# Patient Record
Sex: Male | Born: 1935 | Race: White | Hispanic: No | Marital: Married | Smoking: Former smoker
Health system: Southern US, Community
[De-identification: ages and names within clinical notes are randomized; demographics above are authoritative.]

## PROBLEM LIST (undated history)

## (undated) DIAGNOSIS — I10 Essential (primary) hypertension: Secondary | ICD-10-CM

## (undated) DIAGNOSIS — T7840XA Allergy, unspecified, initial encounter: Secondary | ICD-10-CM

## (undated) HISTORY — DX: Essential (primary) hypertension: I10

## (undated) HISTORY — DX: Allergy, unspecified, initial encounter: T78.40XA

## (undated) HISTORY — PX: NASAL SEPTUM SURGERY: SHX37

## (undated) HISTORY — PX: APPENDECTOMY: SHX54

## (undated) HISTORY — PX: EYE SURGERY: SHX253

---

## 2008-09-23 ENCOUNTER — Ambulatory Visit: Payer: Self-pay | Admitting: Otolaryngology

## 2009-11-25 ENCOUNTER — Ambulatory Visit: Payer: Self-pay | Admitting: Otolaryngology

## 2013-11-19 ENCOUNTER — Encounter: Payer: Self-pay | Admitting: Podiatry

## 2013-11-19 ENCOUNTER — Ambulatory Visit (INDEPENDENT_AMBULATORY_CARE_PROVIDER_SITE_OTHER): Payer: Medicare Other

## 2013-11-19 ENCOUNTER — Ambulatory Visit (INDEPENDENT_AMBULATORY_CARE_PROVIDER_SITE_OTHER): Payer: Medicare Other | Admitting: Podiatry

## 2013-11-19 VITALS — BP 91/68 | HR 67 | Resp 16 | Ht 67.0 in | Wt 203.0 lb

## 2013-11-19 DIAGNOSIS — M722 Plantar fascial fibromatosis: Secondary | ICD-10-CM

## 2013-11-19 DIAGNOSIS — M2420 Disorder of ligament, unspecified site: Secondary | ICD-10-CM

## 2013-11-19 DIAGNOSIS — M629 Disorder of muscle, unspecified: Secondary | ICD-10-CM

## 2013-11-19 MED ORDER — MELOXICAM 15 MG PO TABS: 15.0000 mg | ORAL_TABLET | Freq: Every day | ORAL | Status: AC

## 2013-11-19 MED ORDER — METHYLPREDNISOLONE (PAK) 4 MG PO TABS: ORAL_TABLET | ORAL | Status: DC

## 2013-11-19 NOTE — Progress Notes (Signed)
   Subjective:    Patient ID: Gerald Weaver, male    DOB: December 14, 1935, 78 y.o.   MRN: [id number]  HPI Comments: Left heel pain, sore. Its been going on for 6 plus weeks, seen another doctor [facility] , dr fowler gave two cortisone injections, gave me antiinflammatory pills, told me to roll the foot on a frozen water bottle , and a night splint.   Foot Pain Associated symptoms include coughing.      Review of Systems  HENT:       Sinus problems  Ringing in ears   Respiratory: Positive for cough.   Musculoskeletal: Positive for back pain.  All other systems reviewed and are negative.       Objective:   Physical Exam: I have reviewed his past history medications allergies surgeries social history and review of systems. Pulses are strongly palpable bilateral. Neurologic sensorium is intact per since once the monofilament. Deep tendon reflexes are intact bilateral. Muscle strength +5 over 5 dorsiflexors plantar flexors inverters everters all intrinsic musculature is intact orthopedic evaluation demonstrates all joints distal to the ankle a full range of motion without crepitation. He has no pain on medial and lateral compression of the calcaneus left. He does have pain on direct palpation of the medial calcaneal tubercle and just slightly distal to that area I am unable to palpate the medial band of the plantar fascia of the left foot but easily palpable on the right foot. He also demonstrates hallux malleus to the right foot. Radiographic evaluation does demonstrate moderate to severe inflammation at the point of attachment at the plantar fascia to the calcaneus. There does appear to be any increased area of intensity just distal to the insertion site of the plantar fascia on the calcaneus. This could be indicative of a tear. Cutaneous evaluation demonstrates supple well hydrated cutis no erythema edema cellulitis drainage or odor.        Assessment & Plan:  Assessment: Possible  tear of the plantar fascia of the left foot. This is associated with trauma.  Plan: Discussed etiology pathology conservative versus surgical therapies. I placed him in a [employer]. Gave him specific instructions as how I want him to use this and I wrote a prescription for Medrol Dosepak he will then follow that with [facility] 1 a day. I will followup with him in 3-4 weeks.

## 2013-11-19 NOTE — Patient Instructions (Signed)
Plantar Fasciitis (Heel Spur Syndrome) with Rehab The plantar fascia is a fibrous, ligament-like, soft-tissue structure that spans the bottom of the foot. Plantar fasciitis is a condition that causes pain in the foot due to inflammation of the tissue. SYMPTOMS   Pain and tenderness on the underneath side of the foot.  Pain that worsens with standing or walking. CAUSES  Plantar fasciitis is caused by irritation and injury to the plantar fascia on the underneath side of the foot. Common mechanisms of injury include:  Direct trauma to bottom of the foot.  Damage to a small nerve that runs under the foot where the main fascia attaches to the heel bone.  Stress placed on the plantar fascia due to bone spurs. RISK INCREASES WITH:   Activities that place stress on the plantar fascia (running, jumping, pivoting, or cutting).  Poor strength and flexibility.  Improperly fitted shoes.  Tight calf muscles.  Flat feet.  Failure to warm-up properly before activity.  Obesity. PREVENTION  Warm up and stretch properly before activity.  Allow for adequate recovery between workouts.  Maintain physical fitness:  Strength, flexibility, and endurance.  Cardiovascular fitness.  Maintain a health body weight.  Avoid stress on the plantar fascia.  Wear properly fitted shoes, including arch supports for individuals who have flat feet. PROGNOSIS  If treated properly, then the symptoms of plantar fasciitis usually resolve without surgery. However, occasionally surgery is necessary. RELATED COMPLICATIONS   Recurrent symptoms that may result in a chronic condition.  Problems of the lower back that are caused by compensating for the injury, such as limping.  Pain or weakness of the foot during push-off following surgery.  Chronic inflammation, scarring, and partial or complete fascia tear, occurring more often from repeated injections. TREATMENT  Treatment initially involves the use of  ice and medication to help reduce pain and inflammation. The use of strengthening and stretching exercises may help reduce pain with activity, especially stretches of the Achilles tendon. These exercises may be performed at home or with a therapist. Your caregiver may recommend that you use heel cups of arch supports to help reduce stress on the plantar fascia. Occasionally, corticosteroid injections are given to reduce inflammation. If symptoms persist for greater than 6 months despite non-surgical (conservative), then surgery may be recommended.  MEDICATION   If pain medication is necessary, then nonsteroidal anti-inflammatory medications, such as aspirin and ibuprofen, or other minor pain relievers, such as acetaminophen, are often recommended.  Do not take pain medication within 7 days before surgery.  Prescription pain relievers may be given if deemed necessary by your caregiver. Use only as directed and only as much as you need.  Corticosteroid injections may be given by your caregiver. These injections should be reserved for the most serious cases, because they may only be given a certain number of times. HEAT AND COLD  Cold treatment (icing) relieves pain and reduces inflammation. Cold treatment should be applied for 10 to 15 minutes every 2 to 3 hours for inflammation and pain and immediately after any activity that aggravates your symptoms. Use ice packs or massage the area with a piece of ice (ice massage).  Heat treatment may be used prior to performing the stretching and strengthening activities prescribed by your caregiver, physical therapist, or athletic trainer. Use a heat pack or soak the injury in warm water. SEEK IMMEDIATE MEDICAL CARE IF:  Treatment seems to offer no benefit, or the condition worsens.  Any medications produce adverse side effects. EXERCISES RANGE   OF MOTION (ROM) AND STRETCHING EXERCISES - Plantar Fasciitis (Heel Spur Syndrome) These exercises may help you  when beginning to rehabilitate your injury. Your symptoms may resolve with or without further involvement from your physician, physical therapist or athletic trainer. While completing these exercises, remember:   Restoring tissue flexibility helps normal motion to return to the joints. This allows healthier, less painful movement and activity.  An effective stretch should be held for at least 30 seconds.  A stretch should never be painful. You should only feel a gentle lengthening or release in the stretched tissue. RANGE OF MOTION - Toe Extension, Flexion  Sit with your right / left leg crossed over your opposite knee.  Grasp your toes and gently pull them back toward the top of your foot. You should feel a stretch on the bottom of your toes and/or foot.  Hold this stretch for __________ seconds.  Now, gently pull your toes toward the bottom of your foot. You should feel a stretch on the top of your toes and or foot.  Hold this stretch for __________ seconds. Repeat __________ times. Complete this stretch __________ times per day.  RANGE OF MOTION - Ankle Dorsiflexion, Active Assisted  Remove shoes and sit on a chair that is preferably not on a carpeted surface.  Place right / left foot under knee. Extend your opposite leg for support.  Keeping your heel down, slide your right / left foot back toward the chair until you feel a stretch at your ankle or calf. If you do not feel a stretch, slide your bottom forward to the edge of the chair, while still keeping your heel down.  Hold this stretch for __________ seconds. Repeat __________ times. Complete this stretch __________ times per day.  STRETCH  Gastroc, Standing  Place hands on wall.  Extend right / left leg, keeping the front knee somewhat bent.  Slightly point your toes inward on your back foot.  Keeping your right / left heel on the floor and your knee straight, shift your weight toward the wall, not allowing your back to  arch.  You should feel a gentle stretch in the right / left calf. Hold this position for __________ seconds. Repeat __________ times. Complete this stretch __________ times per day. STRETCH  Soleus, Standing  Place hands on wall.  Extend right / left leg, keeping the other knee somewhat bent.  Slightly point your toes inward on your back foot.  Keep your right / left heel on the floor, bend your back knee, and slightly shift your weight over the back leg so that you feel a gentle stretch deep in your back calf.  Hold this position for __________ seconds. Repeat __________ times. Complete this stretch __________ times per day. STRETCH  Gastrocsoleus, Standing  Note: This exercise can place a lot of stress on your foot and ankle. Please complete this exercise only if specifically instructed by your caregiver.   Place the ball of your right / left foot on a step, keeping your other foot firmly on the same step.  Hold on to the wall or a rail for balance.  Slowly lift your other foot, allowing your body weight to press your heel down over the edge of the step.  You should feel a stretch in your right / left calf.  Hold this position for __________ seconds.  Repeat this exercise with a slight bend in your right / left knee. Repeat __________ times. Complete this stretch __________ times per day.    STRENGTHENING EXERCISES - Plantar Fasciitis (Heel Spur Syndrome)  These exercises may help you when beginning to rehabilitate your injury. They may resolve your symptoms with or without further involvement from your physician, physical therapist or athletic trainer. While completing these exercises, remember:   Muscles can gain both the endurance and the strength needed for everyday activities through controlled exercises.  Complete these exercises as instructed by your physician, physical therapist or athletic trainer. Progress the resistance and repetitions only as guided. STRENGTH - Towel  Curls  Sit in a chair positioned on a non-carpeted surface.  Place your foot on a towel, keeping your heel on the floor.  Pull the towel toward your heel by only curling your toes. Keep your heel on the floor.  If instructed by your physician, physical therapist or athletic trainer, add ____________________ at the end of the towel. Repeat __________ times. Complete this exercise __________ times per day. STRENGTH - Ankle Inversion  Secure one end of a rubber exercise band/tubing to a fixed object (table, pole). Loop the other end around your foot just before your toes.  Place your fists between your knees. This will focus your strengthening at your ankle.  Slowly, pull your big toe up and in, making sure the band/tubing is positioned to resist the entire motion.  Hold this position for __________ seconds.  Have your muscles resist the band/tubing as it slowly pulls your foot back to the starting position. Repeat __________ times. Complete this exercises __________ times per day.  Document Released: 08/14/2005 Document Revised: 11/06/2011 Document Reviewed: 11/26/2008 [employer] Patient Information 2014 [employer]. Plantar Fasciitis Plantar fasciitis is a common condition that causes foot pain. It is soreness (inflammation) of the band of tough fibrous tissue on the bottom of the foot that runs from the heel bone (calcaneus) to the ball of the foot. The cause of this soreness may be from excessive standing, poor fitting shoes, running on hard surfaces, being overweight, having an abnormal walk, or overuse (this is common in runners) of the painful foot or feet. It is also common in aerobic exercise dancers and ballet dancers. SYMPTOMS  Most people with plantar fasciitis complain of:  Severe pain in the morning on the bottom of their foot especially when taking the first steps out of bed. This pain recedes after a few minutes of walking.  Severe pain is experienced also during walking  following a long period of inactivity.  Pain is worse when walking barefoot or up stairs DIAGNOSIS   Your caregiver will diagnose this condition by examining and feeling your foot.  Special tests such as X-rays of your foot, are usually not needed. PREVENTION   Consult a sports medicine professional before beginning a new exercise program.  Walking programs offer a good workout. With walking there is a lower chance of overuse injuries common to runners. There is less impact and less jarring of the joints.  Begin all new exercise programs slowly. If problems or pain develop, decrease the amount of time or distance until you are at a comfortable level.  Wear good shoes and replace them regularly.  Stretch your foot and the heel cords at the back of the ankle (Achilles tendon) both before and after exercise.  Run or exercise on even surfaces that are not hard. For example, asphalt is better than pavement.  Do not run barefoot on hard surfaces.  If using a treadmill, vary the incline.  Do not continue to workout if you have foot or joint   problems. Seek professional help if they do not improve. HOME CARE INSTRUCTIONS   Avoid activities that cause you pain until you recover.  Use ice or cold packs on the problem or painful areas after working out.  Only take over-the-counter or prescription medicines for pain, discomfort, or fever as directed by your caregiver.  Soft shoe inserts or athletic shoes with air or gel sole cushions may be helpful.  If problems continue or become more severe, consult a sports medicine caregiver or your own health care provider. Cortisone is a potent anti-inflammatory medication that may be injected into the painful area. You can discuss this treatment with your caregiver. MAKE SURE YOU:   Understand these instructions.  Will watch your condition.  Will get help right away if you are not doing well or get worse. Document Released: 05/09/2001 Document  Revised: 11/06/2011 Document Reviewed: 07/08/2008 [employer] Patient Information 2014 [employer].  

## 2013-12-12 ENCOUNTER — Ambulatory Visit: Payer: Self-pay | Admitting: Nurse Practitioner

## 2013-12-12 ENCOUNTER — Emergency Department: Payer: Self-pay | Admitting: Emergency Medicine

## 2013-12-12 LAB — COMPREHENSIVE METABOLIC PANEL
ALBUMIN: 3.8 g/dL (ref 3.4–5.0)
ALT: 33 U/L (ref 12–78)
ANION GAP: 5 — AB (ref 7–16)
AST: 57 U/L — AB (ref 15–37)
Alkaline Phosphatase: 103 U/L
BILIRUBIN TOTAL: 0.6 mg/dL (ref 0.2–1.0)
BUN: 24 mg/dL — AB (ref 7–18)
CHLORIDE: 105 mmol/L (ref 98–107)
CO2: 25 mmol/L (ref 21–32)
CREATININE: 1.06 mg/dL (ref 0.60–1.30)
Calcium, Total: 8.9 mg/dL (ref 8.5–10.1)
EGFR (African American): >60
GLUCOSE: 81 mg/dL (ref 65–99)
OSMOLALITY: 273 (ref 275–301)
POTASSIUM: 4.8 mmol/L (ref 3.5–5.1)
SODIUM: 135 mmol/L — AB (ref 136–145)
TOTAL PROTEIN: 7.7 g/dL (ref 6.4–8.2)

## 2013-12-12 LAB — PROTIME-INR
INR: 0.9
Prothrombin Time: 12.2 secs (ref 11.5–14.7)

## 2013-12-12 LAB — CBC
HCT: 41.7 % (ref 40.0–52.0)
HGB: 13.3 g/dL (ref 13.0–18.0)
MCH: 27.6 pg (ref 26.0–34.0)
MCHC: 31.9 g/dL — ABNORMAL LOW (ref 32.0–36.0)
MCV: 87 fL (ref 80–100)
Platelet: 211 10*3/uL (ref 150–440)
RBC: 4.82 10*6/uL (ref 4.40–5.90)
RDW: 14.1 % (ref 11.5–14.5)
WBC: 8.9 10*3/uL (ref 3.8–10.6)

## 2013-12-12 LAB — APTT: ACTIVATED PTT: 27.1 s (ref 23.6–35.9)

## 2013-12-12 LAB — D-DIMER(ARMC): D-Dimer: >7500 ng/ml

## 2013-12-15 ENCOUNTER — Ambulatory Visit (INDEPENDENT_AMBULATORY_CARE_PROVIDER_SITE_OTHER): Payer: Medicare Other | Admitting: Podiatry

## 2013-12-15 VITALS — Resp 16 | Ht 67.0 in | Wt 203.0 lb

## 2013-12-15 DIAGNOSIS — M722 Plantar fascial fibromatosis: Secondary | ICD-10-CM

## 2013-12-15 NOTE — Progress Notes (Signed)
He presents today for followup of his tear of his plantar fasciitis of his left heel. States that he to the [employer] off for the last 2 days and seems to be doing quite well without it. He does relate that he is recently diagnosed with a DVT of his right calf. He is currently taking blood thinners.  Objective: Vital signs are stable he is alert and oriented x3 pulses are palpable. No pain to his left foot.  Assessment: Plantar fasciitis with a rupture of the plantar fascial left heel. DVT right calf.  Plan:

## 2014-01-07 ENCOUNTER — Ambulatory Visit (INDEPENDENT_AMBULATORY_CARE_PROVIDER_SITE_OTHER): Payer: Medicare Other | Admitting: Podiatry

## 2014-01-07 DIAGNOSIS — M722 Plantar fascial fibromatosis: Secondary | ICD-10-CM

## 2014-01-07 NOTE — Progress Notes (Signed)
He presented today to pick up his orthotics both oral and written home-going instructions were given for the use of the orthotics and will followup with him in one month.

## 2014-01-07 NOTE — Patient Instructions (Signed)

## 2014-02-04 ENCOUNTER — Ambulatory Visit (INDEPENDENT_AMBULATORY_CARE_PROVIDER_SITE_OTHER): Payer: Medicare Other | Admitting: Podiatry

## 2014-02-04 VITALS — BP 113/73 | HR 63 | Resp 16

## 2014-02-04 DIAGNOSIS — M722 Plantar fascial fibromatosis: Secondary | ICD-10-CM

## 2014-02-04 NOTE — Progress Notes (Signed)
His presents today for followup of his orthotics. He states that his orthotics for approximately 50% of the time. His right foot he has no problems with however his left foot he states he has pain along the medial longitudinal arch it feels like pressure at times.  Objective: Vital signs are stable he is alert and oriented x3 is no reproducible pain on palpation of the medial continued tubercles bilateral or of the arch. Evaluation of the orthotics appear to be spot on as far as the fabrication of the orthotic goes.  Assessment: Slowly resolving plantar fasciitis left foot.  Plan: Continue use of the orthotics followup with me as needed. And continue all conservative therapies until his 100% well.

## 2014-04-03 DIAGNOSIS — M722 Plantar fascial fibromatosis: Secondary | ICD-10-CM

## 2014-04-27 ENCOUNTER — Telehealth: Payer: Self-pay | Admitting: Podiatry

## 2014-04-27 ENCOUNTER — Encounter: Payer: Self-pay | Admitting: Podiatry

## 2014-04-27 NOTE — Telephone Encounter (Signed)
Notified patient that his 2nd pair of orthotics are in and he can stop by the office at any time to pick up.

## 2015-11-29 IMAGING — US US EXTREM LOW VENOUS*R*
1 series · 13 of 24 positions shown · non-contrast
Comparison: None

CLINICAL DATA: Right leg pain and swelling



[Series 1: us extrem low venous*right* · 0.08mm/px · 13 of 34 slices shown]
[im 1/34]
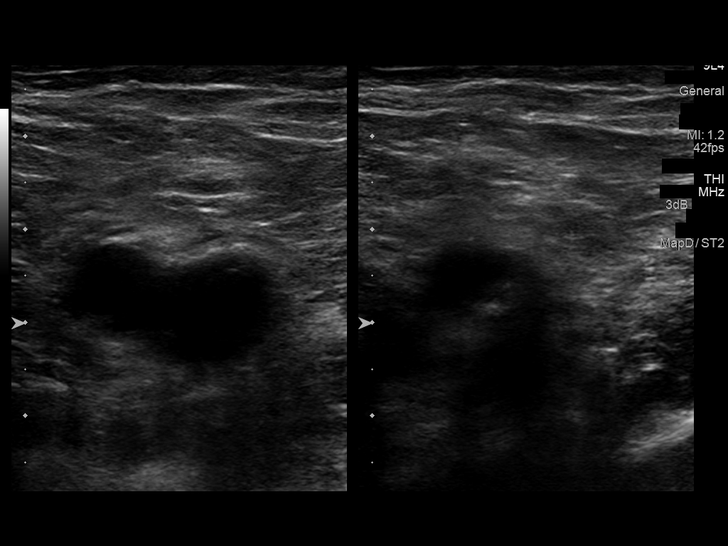
[im 3/34]
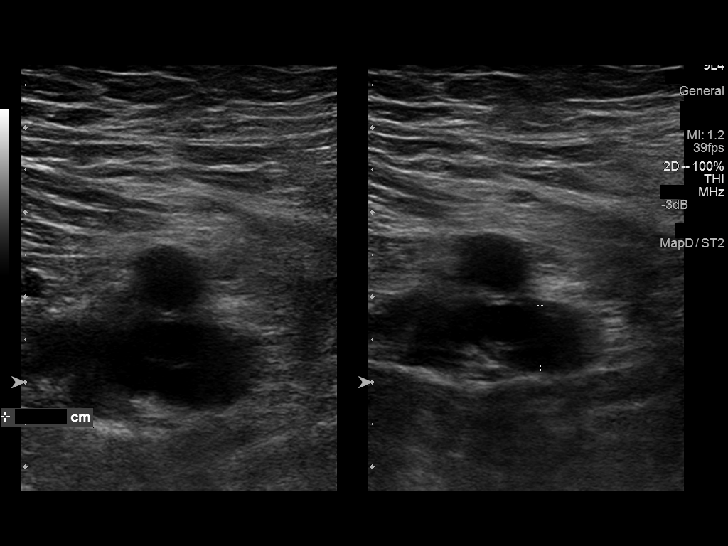
[im 6/34]
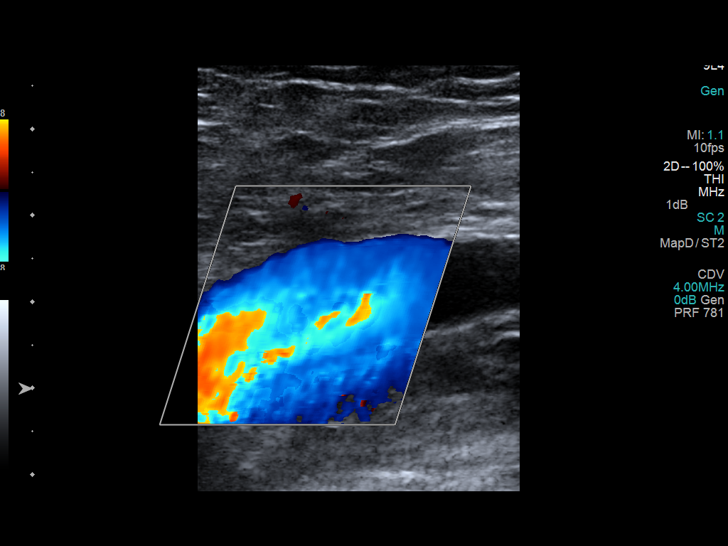
[im 9/34]
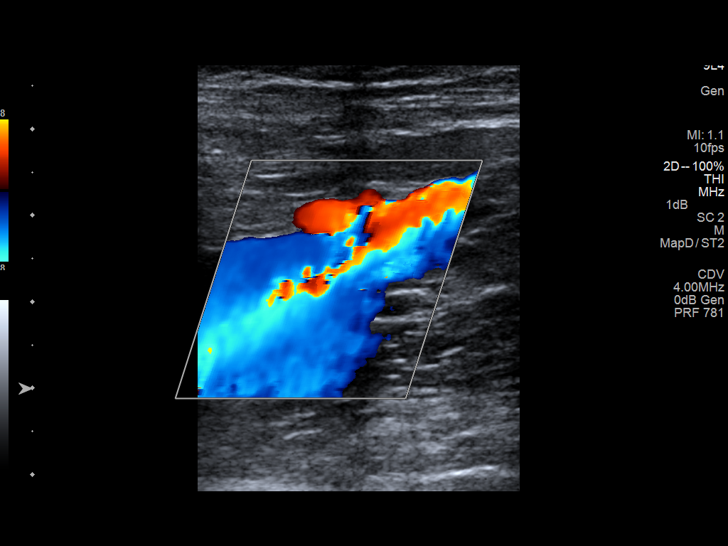
[im 12/34]
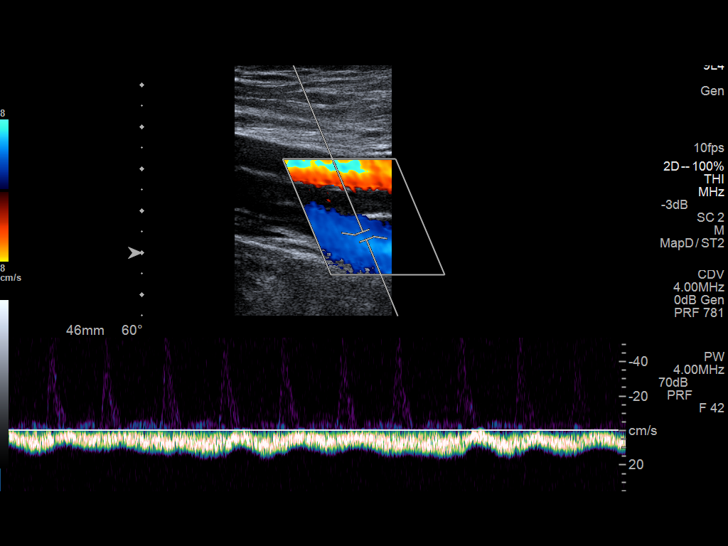
[im 15/34]
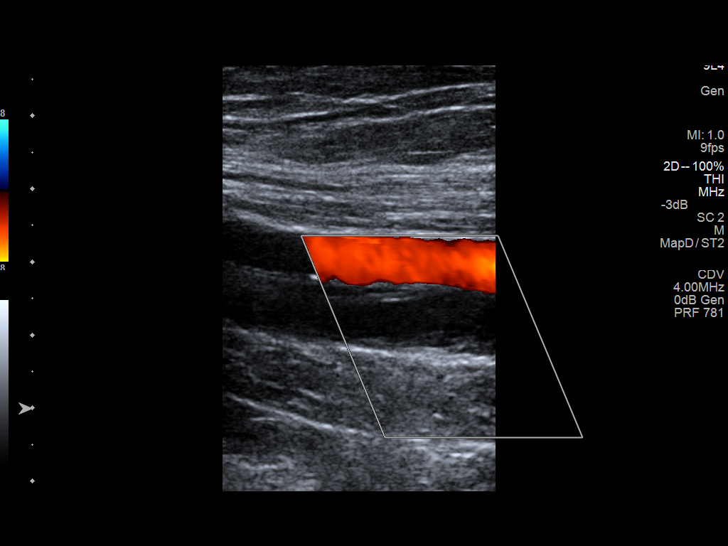
[im 18/34]
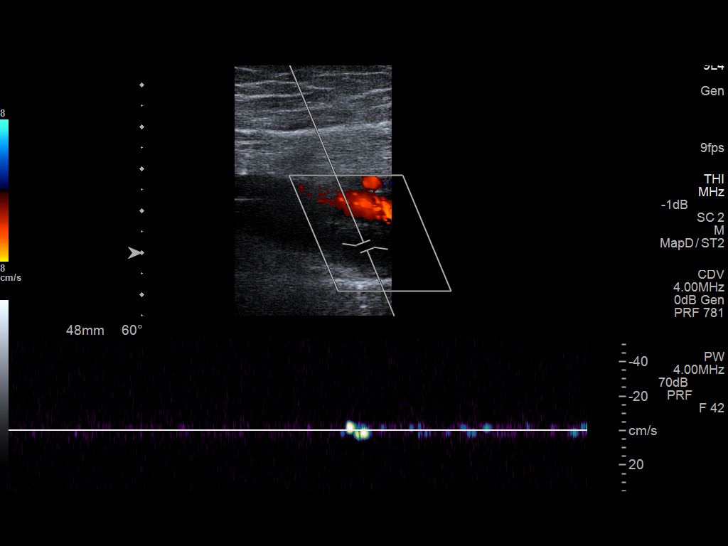
[im 19/34]
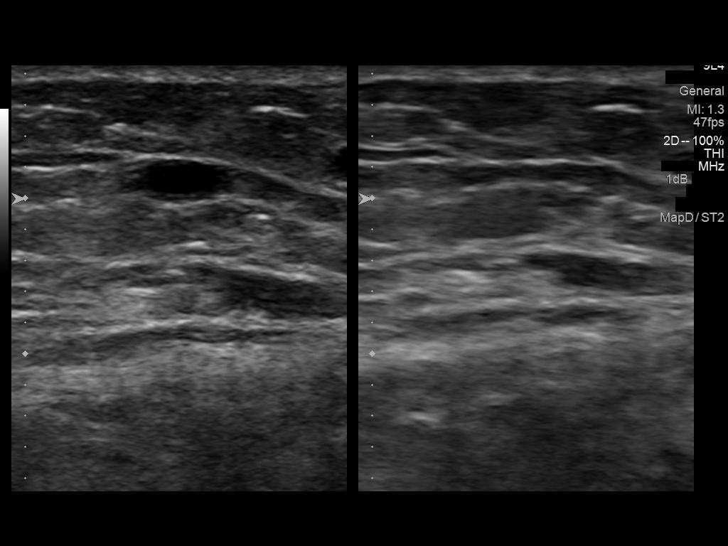
[im 22/34]
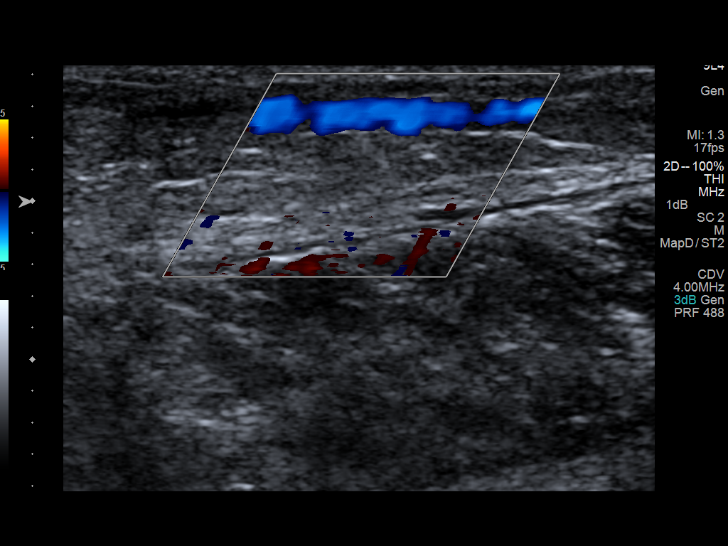
[im 25/34]
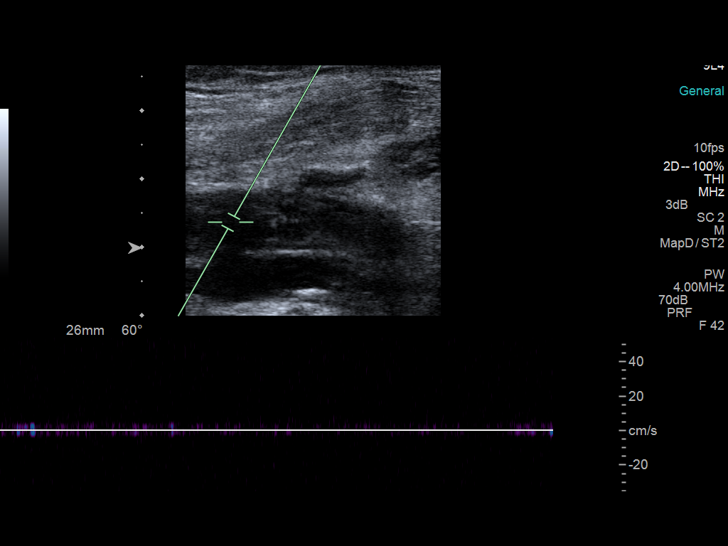
[im 28/34]
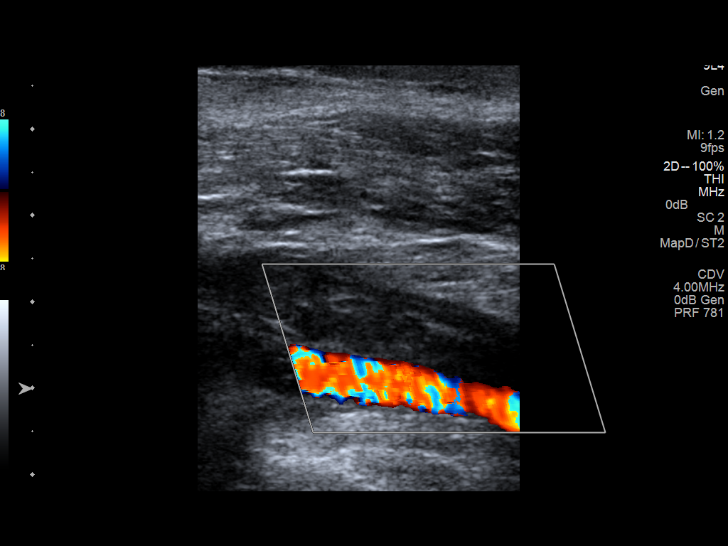
[im 31/34]
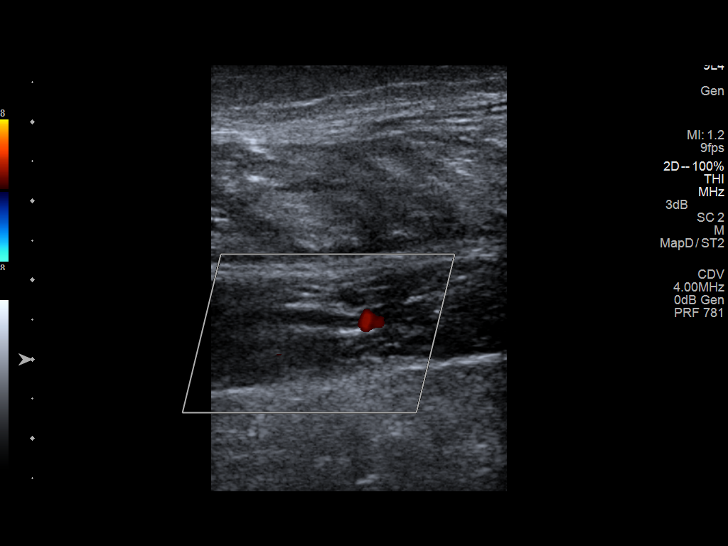
[im 34/34]
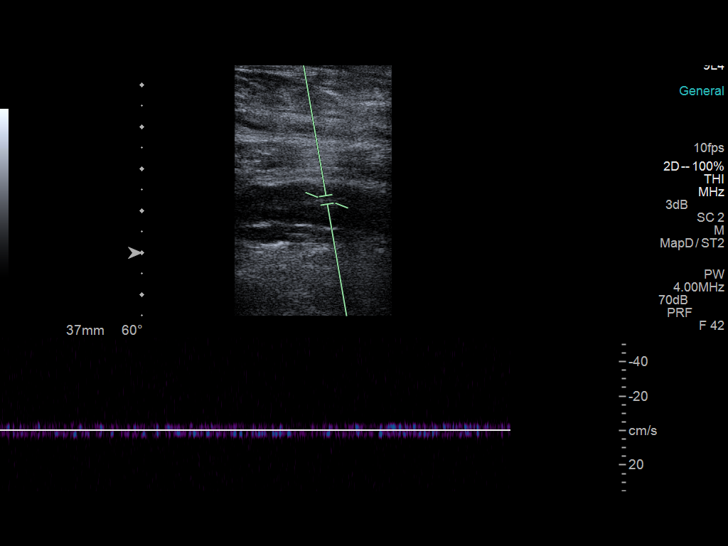

[13 of 24 positions shown; findings below may reference images not displayed]

FINDINGS: Common Femoral Vein: No evidence of thrombus. Normal compressibility
and respiratory phasicity.

Saphenofemoral Junction: No evidence of thrombus. Normal
compressibility and flow on color Doppler imaging.

Profunda Femoral Vein: No evidence of thrombus. Normal
compressibility and flow on color Doppler imaging.

Femoral Vein: Hypoechoic occlusive intraluminal thrombus identified
throughout. Impaired compressibility. No spontaneous flow.

Popliteal Vein: Hypoechoic occlusive intraluminal thrombus
identified throughout. Impaired compressibility. No spontaneous
flow.

Calf Veins: Posterior to the peroneal veins are
visualized.Hypoechoic occlusive intraluminal thrombus identified at
visualized portions. Impaired compressibility. No spontaneous flow.

Superficial Great Saphenous Vein: No evidence of thrombus. Normal
compressibility and flow on color Doppler imaging.

Venous Reflux:  None.

Other Findings:  Augmentation not performed.
IMPRESSION: Deep venous thrombosis involving the right femoral vein, right
popliteal vein, and visualized portions of the right posterior
tibial and peroneal veins.

These results will be called to the ordering clinician or
representative by the Radiologist Assistant, and communication
documented in the PACS Dashboard.

## 2019-11-03 ENCOUNTER — Other Ambulatory Visit: Payer: Self-pay | Admitting: Internal Medicine

## 2019-11-03 DIAGNOSIS — E782 Mixed hyperlipidemia: Secondary | ICD-10-CM

## 2019-11-03 DIAGNOSIS — H3562 Retinal hemorrhage, left eye: Secondary | ICD-10-CM

## 2019-11-03 DIAGNOSIS — H34212 Partial retinal artery occlusion, left eye: Secondary | ICD-10-CM

## 2019-11-06 ENCOUNTER — Ambulatory Visit: Payer: Self-pay

## 2020-10-05 ENCOUNTER — Other Ambulatory Visit: Payer: Self-pay

## 2020-10-05 ENCOUNTER — Other Ambulatory Visit
Admission: RE | Admit: 2020-10-05 | Discharge: 2020-10-05 | Disposition: A | Discharge status: Home / Self Care | Payer: Medicare Other | Source: Ambulatory Visit | Attending: Internal Medicine | Admitting: Internal Medicine

## 2020-10-05 DIAGNOSIS — Z20822 Contact with and (suspected) exposure to covid-19: Secondary | ICD-10-CM | POA: Insufficient documentation

## 2020-10-05 DIAGNOSIS — Z01812 Encounter for preprocedural laboratory examination: Secondary | ICD-10-CM | POA: Diagnosis present

## 2020-10-05 LAB — SARS CORONAVIRUS 2 (TAT 6-24 HRS): SARS Coronavirus 2: NEGATIVE

## 2020-10-07 ENCOUNTER — Ambulatory Visit: Payer: Medicare Other | Admitting: Certified Registered Nurse Anesthetist

## 2020-10-07 ENCOUNTER — Encounter: Payer: Self-pay | Admitting: Internal Medicine

## 2020-10-07 ENCOUNTER — Other Ambulatory Visit: Payer: Self-pay

## 2020-10-07 ENCOUNTER — Encounter
Admission: RE | Disposition: A | Discharge status: Home / Self Care | Payer: Self-pay | Source: Home / Self Care | Attending: Internal Medicine

## 2020-10-07 ENCOUNTER — Ambulatory Visit
Admission: RE | Admit: 2020-10-07 | Discharge: 2020-10-07 | Disposition: A | Discharge status: Home / Self Care | Payer: Medicare Other | Attending: Internal Medicine | Admitting: Internal Medicine

## 2020-10-07 DIAGNOSIS — Z87891 Personal history of nicotine dependence: Secondary | ICD-10-CM | POA: Insufficient documentation

## 2020-10-07 DIAGNOSIS — Z79890 Hormone replacement therapy (postmenopausal): Secondary | ICD-10-CM | POA: Insufficient documentation

## 2020-10-07 DIAGNOSIS — Z7901 Long term (current) use of anticoagulants: Secondary | ICD-10-CM | POA: Insufficient documentation

## 2020-10-07 DIAGNOSIS — D124 Benign neoplasm of descending colon: Secondary | ICD-10-CM | POA: Insufficient documentation

## 2020-10-07 DIAGNOSIS — D122 Benign neoplasm of ascending colon: Secondary | ICD-10-CM | POA: Insufficient documentation

## 2020-10-07 DIAGNOSIS — K921 Melena: Secondary | ICD-10-CM | POA: Diagnosis not present

## 2020-10-07 DIAGNOSIS — Z79899 Other long term (current) drug therapy: Secondary | ICD-10-CM | POA: Diagnosis not present

## 2020-10-07 DIAGNOSIS — K640 First degree hemorrhoids: Secondary | ICD-10-CM | POA: Diagnosis not present

## 2020-10-07 DIAGNOSIS — Z791 Long term (current) use of non-steroidal anti-inflammatories (NSAID): Secondary | ICD-10-CM | POA: Insufficient documentation

## 2020-10-07 DIAGNOSIS — R933 Abnormal findings on diagnostic imaging of other parts of digestive tract: Secondary | ICD-10-CM | POA: Diagnosis not present

## 2020-10-07 HISTORY — PX: COLONOSCOPY WITH PROPOFOL: SHX5780

## 2020-10-07 SURGERY — COLONOSCOPY WITH PROPOFOL
Anesthesia: General

## 2020-10-07 MED ORDER — LIDOCAINE HCL (CARDIAC) PF 100 MG/5ML IV SOSY
PREFILLED_SYRINGE | INTRAVENOUS | Status: DC | PRN
  Administered 2020-10-07: 100 mg via INTRAVENOUS

## 2020-10-07 MED ORDER — PHENYLEPHRINE HCL (PRESSORS) 10 MG/ML IV SOLN
INTRAVENOUS | Status: DC | PRN
  Administered 2020-10-07 (×3): 100 ug via INTRAVENOUS

## 2020-10-07 MED ORDER — PROPOFOL 500 MG/50ML IV EMUL
INTRAVENOUS | Status: DC | PRN
  Administered 2020-10-07: 140 ug/kg/min via INTRAVENOUS

## 2020-10-07 MED ORDER — PROPOFOL 10 MG/ML IV BOLUS
INTRAVENOUS | Status: DC | PRN
  Administered 2020-10-07: 20 mg via INTRAVENOUS
  Administered 2020-10-07: 40 mg via INTRAVENOUS
  Administered 2020-10-07: 20 mg via INTRAVENOUS

## 2020-10-07 MED ORDER — SODIUM CHLORIDE 0.9 % IV SOLN: INTRAVENOUS | Status: DC

## 2020-10-07 NOTE — Anesthesia Postprocedure Evaluation (Signed)
Anesthesia Post Note  Patient: Gerald Weaver  Procedure(s) Performed: COLONOSCOPY WITH PROPOFOL (N/A )  Patient location during evaluation: Endoscopy Anesthesia Type: General Level of consciousness: awake and alert Pain management: pain level controlled Vital Signs Assessment: post-procedure vital signs reviewed and stable Respiratory status: spontaneous breathing and respiratory function stable Cardiovascular status: stable Anesthetic complications: no   No complications documented.   Last Vitals:  Vitals:   10/07/20 1234 10/07/20 1244  BP: 103/66 (!) (P) 96/57  Pulse: 86   Resp: 15   Temp:    SpO2: 94%     Last Pain:  Vitals:   10/07/20 1244  TempSrc:   PainSc: (P) 0-No pain                 KEPHART,WILLIAM K

## 2020-10-07 NOTE — Interval H&P Note (Signed)
History and Physical Interval Note:  10/07/2020 12:00 PM  Gerald Weaver  has presented today for surgery, with the diagnosis of RECTAL BLEED.  The various methods of treatment have been discussed with the patient and family. After consideration of risks, benefits and other options for treatment, the patient has consented to  Procedure(s): COLONOSCOPY WITH PROPOFOL (N/A) as a surgical intervention.  The patient's history has been reviewed, patient examined, no change in status, stable for surgery.  I have reviewed the patient's chart and labs.  Questions were answered to the patient's satisfaction.     [place], [place]

## 2020-10-07 NOTE — Op Note (Signed)
[facility] Gastroenterology Patient Name: Gerald Weaver Procedure Date: 10/07/2020 11:04 AM MRN: [id number] Account #: [id number] Date of Birth: [birth year] Admit Type: Outpatient Age: 85 Room: [facility] ENDO ROOM 4 Gender: Male Note Status: Finalized Procedure:             Colonoscopy Indications:           Hematochezia, Abnormal CT of the GI tract Providers:             Benay Pike. Alice Reichert MD, MD Referring MD:          Fonnie Jarvis. Ilene Qua, MD (Referring MD) Medicines:             Propofol per Anesthesia Complications:         No immediate complications. Procedure:             Pre-Anesthesia Assessment:                        - The risks and benefits of the procedure and the                         sedation options and risks were discussed with the                         patient. All questions were answered and informed                         consent was obtained.                        - Patient identification and proposed procedure were                         verified prior to the procedure by the nurse. The                         procedure was verified in the procedure room.                        - ASA Grade Assessment: III - A patient with severe                         systemic disease.                        - After reviewing the risks and benefits, the patient                         was deemed in satisfactory condition to undergo the                         procedure.                        After obtaining informed consent, the colonoscope was                         passed under direct vision. Throughout the procedure,                         the patient's  blood pressure, pulse, and oxygen                         saturations were monitored continuously. The                         Colonoscope was introduced through the anus and                         advanced to the the cecum, identified by appendiceal                         orifice and ileocecal valve.  The colonoscopy was                         performed without difficulty. The patient tolerated                         the procedure well. The quality of the bowel                         preparation was adequate. The ileocecal valve,                         appendiceal orifice, and rectum were photographed. Findings:      The perianal and digital rectal examinations were normal. Pertinent       negatives include normal sphincter tone and no palpable rectal lesions.      Non-bleeding internal hemorrhoids were found during retroflexion. The       hemorrhoids were Grade I (internal hemorrhoids that do not prolapse).      Two sessile polyps were found in the descending colon and ascending       colon. The polyps were 4 to 7 mm in size. These polyps were removed with       a jumbo cold forceps. Resection and retrieval were complete.      The exam was otherwise without abnormality. Impression:            - Non-bleeding internal hemorrhoids.                        - Two 4 to 7 mm polyps in the descending colon and in                         the ascending colon, removed with a jumbo cold                         forceps. Resected and retrieved.                        - The examination was otherwise normal. Recommendation:        - Patient has a contact number available for                         emergencies. The signs and symptoms of potential                         delayed complications were discussed with the patient.  Return to normal activities tomorrow. Written                         discharge instructions were provided to the patient.                        - Resume previous diet.                        - Continue present medications.                        - Await pathology results.                        - If polyps are benign or adenomatous without                         dysplasia, I will advise NO further colonoscopy due to                         advanced  age and/or severe comorbidity.                        - Return to GI office PRN.                        - The findings and recommendations were discussed with                         the patient. Procedure Code(s):     --- Professional ---                        [phone], Colonoscopy, flexible; with biopsy, single or                         multiple Diagnosis Code(s):     --- Professional ---                        R93.3, Abnormal findings on diagnostic imaging of                         other parts of digestive tract                        K92.1, Melena (includes Hematochezia)                        K64.0, First degree hemorrhoids                        K63.5, Polyp of colon CPT copyright 2019 American Medical Association. All rights reserved. The codes documented in this report are preliminary and upon coder review may  be revised to meet current compliance requirements. Efrain Sella MD, MD 10/07/2020 12:24:16 PM This report has been signed electronically. Number of Addenda: 0 Note Initiated On: 10/07/2020 11:04 AM Scope Withdrawal Time: 0 hours 7 minutes 29 seconds  Total Procedure Duration: 0 hours 13 minutes 3 seconds  Estimated Blood Loss:  Estimated blood loss: none.      [place]  [facility]

## 2020-10-07 NOTE — Anesthesia Procedure Notes (Signed)
Procedure Name: MAC Date/Time: 10/07/2020 12:06 PM Performed by: Lily Peer, Rahaf Carbonell, CRNA Pre-anesthesia Checklist: Patient identified, Emergency Drugs available, Suction available, Patient being monitored and Timeout performed Oxygen Delivery Method: Nasal cannula Induction Type: IV induction

## 2020-10-07 NOTE — Anesthesia Preprocedure Evaluation (Signed)
Anesthesia Evaluation  Patient identified by MRN, date of birth, ID band Patient awake    Reviewed: Allergy & Precautions, H&P , NPO status , Patient's Chart, lab work & pertinent test results, reviewed documented beta blocker date and time   Airway Mallampati: III   Neck ROM: full    Dental  (+) Teeth Intact   Pulmonary neg pulmonary ROS, former smoker,    Pulmonary exam normal        Cardiovascular Exercise Tolerance: Poor hypertension, On Medications negative cardio ROS Normal cardiovascular exam Rhythm:regular Rate:Normal  DVT HX   Neuro/Psych negative neurological ROS  negative psych ROS   GI/Hepatic negative GI ROS, Neg liver ROS,   Endo/Other  negative endocrine ROS  Renal/GU negative Renal ROS  negative genitourinary   Musculoskeletal   Abdominal   Peds  Hematology negative hematology ROS (+)   Anesthesia Other Findings Past Medical History: No date: Allergy No date: Hypertension Past Surgical History: No date: APPENDECTOMY No date: EYE SURGERY No date: NASAL SEPTUM SURGERY BMI    Body Mass Index: 31.64 kg/m     Reproductive/Obstetrics negative OB ROS                             Anesthesia Physical Anesthesia Plan  ASA: III  Anesthesia Plan: General   Post-op Pain Management:    Induction:   PONV Risk Score and Plan: 2  Airway Management Planned:   Additional Equipment:   Intra-op Plan:   Post-operative Plan:   Informed Consent: I have reviewed the patients History and Physical, chart, labs and discussed the procedure including the risks, benefits and alternatives for the proposed anesthesia with the patient or authorized representative who has indicated his/her understanding and acceptance.     Dental Advisory Given  Plan Discussed with: CRNA  Anesthesia Plan Comments:         Anesthesia Quick Evaluation

## 2020-10-07 NOTE — H&P (Signed)
Outpatient short stay form Pre-procedure 10/07/2020 11:55 AM Anayely Constantine K. Alice Reichert, M.D.  Primary Physician: Ezequiel Kayser, M.D.  Reason for visit:  Lower GI bleed, abnormal CT of the GI tract  History of present illness:  Pleasant 85 y/o male presents with recent hx of Abnormal LGI bleed with finding of calcified mesenteric mass, though no intraluminal findings in the GI tract.  Currently bleeding has subsided. Patient denies any abdominal pain, syncope or weakness.    Current Facility-Administered Medications:  .  0.9 %  sodium chloride infusion, , Intravenous, Continuous, [place], Benay Pike, MD, Last Rate: 20 mL/hr at 10/07/20 1104, Continued from Pre-op at 10/07/20 1104  Medications Prior to Admission  Medication Sig Dispense Refill Last Dose  . lisinopril-hydrochlorothiazide (PRINZIDE,ZESTORETIC) 10-12.5 MG per tablet    10/05/2020 at Unknown time  . acyclovir (ZOVIRAX) 400 MG tablet  (Patient not taking: No sig reported)   Not Taking at Unknown time  . apixaban (ELIQUIS) 5 MG TABS tablet Take 5 mg by mouth 2 (two) times daily.   10/04/2020  . B Complex-C (SUPER B COMPLEX PO) Take by mouth.   10/05/2020  . cetirizine (ZYRTEC) 10 MG tablet Take 10 mg by mouth daily.   10/05/2020  . glucosamine-chondroitin 500-400 MG tablet Take 1 tablet by mouth 3 (three) times daily. (Patient not taking: Reported on 10/07/2020)   Not Taking at Unknown time  . levothyroxine (SYNTHROID, LEVOTHROID) 125 MCG tablet 125 mcg.    10/05/2020  . meloxicam (MOBIC) 15 MG tablet Take 1 tablet (15 mg total) by mouth daily. 30 tablet 3   . methylPREDNIsolone (MEDROL DOSPACK) 4 MG tablet follow package directions 21 tablet 0   . Multiple Vitamins-Minerals (CENTRUM SILVER ADULT 50+ PO) Take by mouth.   10/05/2020  . Saw Palmetto 450 MG CAPS Take by mouth. (Patient not taking: Reported on 10/07/2020)   Not Taking at Unknown time  . Triamcinolone Acetonide (NASACORT AQ NA) Place into the nose.   10/05/2020     Allergies  Allergen  Reactions  . Augmentin [Amoxicillin-Pot Clavulanate] Nausea And Vomiting     Past Medical History:  Diagnosis Date  . Allergy   . Hypertension     Review of systems:  Otherwise negative.    Physical Exam  Gen: Alert, oriented. Appears stated age.  HEENT: [place]/AT. PERRLA. Lungs: CTA, no wheezes. CV: RR nl S1, S2. Abd: soft, benign, no masses. BS+ Ext: No edema. Pulses 2+    Planned procedures: Proceed with colonoscopy. The patient understands the nature of the planned procedure, indications, risks, alternatives and potential complications including but not limited to bleeding, infection, perforation, damage to internal organs and possible oversedation/side effects from anesthesia. The patient agrees and gives consent to proceed.  Please refer to procedure notes for findings, recommendations and patient disposition/instructions.     Grayton Lobo K. Alice Reichert, M.D. Gastroenterology 10/07/2020  11:55 AM

## 2020-10-07 NOTE — Transfer of Care (Signed)
Immediate Anesthesia Transfer of Care Note  Patient: Gerald Weaver  Procedure(s) Performed: COLONOSCOPY WITH PROPOFOL (N/A )  Patient Location: PACU and Endoscopy Unit  Anesthesia Type:General  Level of Consciousness: drowsy  Airway & Oxygen Therapy: Patient Spontanous Breathing  Post-op Assessment: Report given to RN and Post -op Vital signs reviewed and stable  Post vital signs: Reviewed and stable  Last Vitals:  Vitals Value Taken Time  BP 94/60   Temp    Pulse 87   Resp 14   SpO2 96     Last Pain:  Vitals:   10/07/20 1044  TempSrc: Temporal  PainSc: 0-No pain         Complications: No complications documented.

## 2020-10-08 ENCOUNTER — Encounter: Payer: Self-pay | Admitting: Internal Medicine

## 2020-10-08 LAB — SURGICAL PATHOLOGY

## 2021-11-07 ENCOUNTER — Ambulatory Visit (INDEPENDENT_AMBULATORY_CARE_PROVIDER_SITE_OTHER): Payer: Medicare Other

## 2021-11-07 ENCOUNTER — Other Ambulatory Visit: Payer: Self-pay

## 2021-11-07 ENCOUNTER — Ambulatory Visit
Admission: EM | Admit: 2021-11-07 | Discharge: 2021-11-07 | Disposition: A | Discharge status: Home / Self Care | Payer: Medicare Other | Attending: Physician Assistant | Admitting: Physician Assistant

## 2021-11-07 DIAGNOSIS — Z87891 Personal history of nicotine dependence: Secondary | ICD-10-CM | POA: Diagnosis not present

## 2021-11-07 DIAGNOSIS — Z20822 Contact with and (suspected) exposure to covid-19: Secondary | ICD-10-CM | POA: Diagnosis not present

## 2021-11-07 DIAGNOSIS — R062 Wheezing: Secondary | ICD-10-CM | POA: Insufficient documentation

## 2021-11-07 DIAGNOSIS — R059 Cough, unspecified: Secondary | ICD-10-CM

## 2021-11-07 DIAGNOSIS — J209 Acute bronchitis, unspecified: Secondary | ICD-10-CM | POA: Insufficient documentation

## 2021-11-07 DIAGNOSIS — R0602 Shortness of breath: Secondary | ICD-10-CM

## 2021-11-07 DIAGNOSIS — I10 Essential (primary) hypertension: Secondary | ICD-10-CM | POA: Insufficient documentation

## 2021-11-07 DIAGNOSIS — R051 Acute cough: Secondary | ICD-10-CM | POA: Diagnosis present

## 2021-11-07 MED ORDER — ALBUTEROL SULFATE HFA 108 (90 BASE) MCG/ACT IN AERS: 1.0000 | INHALATION_SPRAY | Freq: Four times a day (QID) | RESPIRATORY_TRACT | 0 refills | Status: AC | PRN

## 2021-11-07 MED ORDER — PREDNISONE 20 MG PO TABS: 40.0000 mg | ORAL_TABLET | Freq: Every day | ORAL | 0 refills | Status: AC

## 2021-11-07 MED ORDER — ALBUTEROL SULFATE (2.5 MG/3ML) 0.083% IN NEBU
2.5000 mg | INHALATION_SOLUTION | Freq: Once | RESPIRATORY_TRACT | Status: AC
  Administered 2021-11-07: 2.5 mg via RESPIRATORY_TRACT

## 2021-11-07 MED ORDER — AZITHROMYCIN 250 MG PO TABS: 250.0000 mg | ORAL_TABLET | Freq: Every day | ORAL | 0 refills | Status: AC

## 2021-11-07 MED ORDER — BENZONATATE 200 MG PO CAPS: 200.0000 mg | ORAL_CAPSULE | Freq: Three times a day (TID) | ORAL | 0 refills | Status: AC | PRN

## 2021-11-07 NOTE — ED Provider Notes (Signed)
[facility]    CSN: [id number] Arrival date & time: 11/07/21  0920      History   Chief Complaint Chief Complaint  Patient presents with   Cough   Nasal Congestion   Shortness of Breath    HPI Gerald Weaver is a 86 y.o. male presenting for 1 week history of cough and nasal congestion.  Patient reports over the past 4 days he has developed wheezing, shortness of breath and increased fatigue.  Patient says it feels like there is something stuck in his chest.  He has felt feverish with chills.  Has not recorded a temperature.  Reports nasal congestion and sinus type symptoms that he has year-round.  Takes over-the-counter and histamine for that.  No history of any respiratory conditions.  No sick contacts.  Patient reports he lives alone.  Medical history significant for hypertension and allergies.  Former smoker.  No other complaints.  HPI  Past Medical History:  Diagnosis Date   Allergy    Hypertension     There are no problems to display for this patient.   Past Surgical History:  Procedure Laterality Date   APPENDECTOMY     COLONOSCOPY WITH PROPOFOL N/A 10/07/2020   Procedure: COLONOSCOPY WITH PROPOFOL;  Surgeon: [place], Benay Pike, MD;  Location: [facility] ENDOSCOPY;  Service: Gastroenterology;  Laterality: N/A;   EYE SURGERY     NASAL SEPTUM SURGERY         Home Medications    Prior to Admission medications   Medication Sig Start Date End Date Taking? Authorizing Provider  albuterol (VENTOLIN HFA) 108 (90 Base) MCG/ACT inhaler Inhale 1-2 puffs into the lungs every 6 (six) hours as needed for wheezing or shortness of breath. 11/07/21  Yes Laurene Footman B, PA-C  azithromycin (ZITHROMAX) 250 MG tablet Take 1 tablet (250 mg total) by mouth daily. Take first 2 tablets together, then 1 every day until finished. 11/07/21  Yes Danton Clap, PA-C  benzonatate (TESSALON) 200 MG capsule Take 1 capsule (200 mg total) by mouth 3 (three) times daily as needed for  cough. 11/07/21  Yes Danton Clap, PA-C  predniSONE (DELTASONE) 20 MG tablet Take 2 tablets (40 mg total) by mouth daily for 5 days. 11/07/21 11/12/21 Yes Danton Clap, PA-C  acyclovir (ZOVIRAX) 400 MG tablet  10/05/13   [provider]  apixaban (ELIQUIS) 5 MG TABS tablet Take 5 mg by mouth 2 (two) times daily.    [provider]  B Complex-C (SUPER B COMPLEX PO) Take by mouth.    [provider]  cetirizine (ZYRTEC) 10 MG tablet Take 10 mg by mouth daily.    [provider]  glucosamine-chondroitin 500-400 MG tablet Take 1 tablet by mouth 3 (three) times daily. Patient not taking: Reported on 10/07/2020    [provider]  levothyroxine (SYNTHROID, LEVOTHROID) 125 MCG tablet 125 mcg.  09/18/13   [provider]  lisinopril-hydrochlorothiazide (PRINZIDE,ZESTORETIC) 10-12.5 MG per tablet  09/18/13   [provider]  meloxicam (MOBIC) 15 MG tablet Take 1 tablet (15 mg total) by mouth daily. 11/19/13   Hyatt, Max T, DPM  Multiple Vitamins-Minerals (CENTRUM SILVER ADULT 50+ PO) Take by mouth.    [provider]  Saw Palmetto 450 MG CAPS Take by mouth. Patient not taking: Reported on 10/07/2020    [provider]  Triamcinolone Acetonide (NASACORT AQ NA) Place into the nose.    [provider]    Family History History  reviewed. No pertinent family history.  Social History Social History   Tobacco Use   Smoking status: Former    Types: Cigarettes   Smokeless tobacco: Never  Vaping Use   Vaping Use: Never used  Substance Use Topics   Alcohol use: Yes    Comment: ocassionally    Drug use: No     Allergies   Augmentin [amoxicillin-pot clavulanate]   Review of Systems Review of Systems  Constitutional:  Positive for fatigue. Negative for fever.  HENT:  Positive for congestion, postnasal drip, rhinorrhea and sinus pressure. Negative for sinus pain and sore throat.   Respiratory:  Positive for  cough, shortness of breath and wheezing.   Cardiovascular:  Negative for chest pain.  Gastrointestinal:  Negative for abdominal pain, diarrhea, nausea and vomiting.  Musculoskeletal:  Negative for myalgias.  Neurological:  Positive for light-headedness (with coughing fits occasionally). Negative for weakness and headaches.  Hematological:  Negative for adenopathy.    Physical Exam Triage Vital Signs ED Triage Vitals  Enc Vitals Group     BP 11/07/21 0930 117/74     Pulse Rate 11/07/21 0930 (!) 105     Resp 11/07/21 0930 18     Temp --      Temp Source 11/07/21 0929 Oral     SpO2 11/07/21 0930 100 %     Weight --      Height --      Head Circumference --      Peak Flow --      Pain Score 11/07/21 0929 0     Pain Loc --      Pain Edu? --      Excl. in [place]? --    No data found.  Updated Vital Signs BP 117/74 (BP Location: Left Arm)    Pulse (!) 105    Temp 97.9 F (36.6 C) (Oral)    Resp 18    SpO2 100%       Physical Exam Vitals and nursing note reviewed.  Constitutional:      General: He is not in acute distress.    Appearance: Normal appearance. He is well-developed. He is ill-appearing.  HENT:     Head: Normocephalic and atraumatic.     Nose: Congestion present.     Mouth/Throat:     Mouth: Mucous membranes are moist.     Pharynx: Oropharynx is clear.  Eyes:     General: No scleral icterus.    Conjunctiva/sclera: Conjunctivae normal.  Cardiovascular:     Rate and Rhythm: Normal rate and regular rhythm.     Heart sounds: Normal heart sounds.  Pulmonary:     Effort: Pulmonary effort is normal. No respiratory distress.     Breath sounds: Wheezing (diffuse wheezes throughout all lung fields) present.     Comments: Appears to be in mild distress with mild shortness of breath.  Still able to speak in full sentences. Musculoskeletal:     Cervical back: Neck supple.     Right lower leg: No edema.     Left lower leg: No edema.  Skin:    General: Skin is warm and  dry.     Capillary Refill: Capillary refill takes less than 2 seconds.  Neurological:     General: No focal deficit present.     Mental Status: He is alert. Mental status is at baseline.     Motor: No weakness.     Gait: Gait normal.  Psychiatric:  Mood and Affect: Mood normal.        Behavior: Behavior normal.        Thought Content: Thought content normal.     UC Treatments / Results  Labs (all labs ordered are listed, but only abnormal results are displayed) Labs Reviewed  SARS CORONAVIRUS 2 (TAT 6-24 HRS)    EKG   Radiology DG Chest 2 View  Result Date: 11/07/2021 CLINICAL DATA:  A mallet age 41 presents for evaluation of cough and wheezing and shortness of breath for 1 week. EXAM: CHEST - 2 VIEW COMPARISON:  None FINDINGS: Cardiomediastinal contours and hilar structures are normal. Bandlike area of added density in the lingula. No lobar consolidative changes. No pneumothorax. No pleural effusion. On limited assessment there is no acute skeletal process. IMPRESSION: Area of suspected lingular scarring or atelectasis. No priors for comparison. No lobar consolidation or effusion. Electronically Signed   By: Zetta Bills M.D.   On: 11/07/2021 10:21    Procedures Procedures (including critical care time)  Medications Ordered in UC Medications  albuterol (PROVENTIL) (2.5 MG/3ML) 0.083% nebulizer solution 2.5 mg (2.5 mg Nebulization Given 11/07/21 1047)    Initial Impression / Assessment and Plan / UC Course  I have reviewed the triage vital signs and the nursing notes.  Pertinent labs & imaging results that were available during my care of the patient were reviewed by me and considered in my medical decision making (see chart for details).   86 y/o male presenting for 1 week history of cough and congestion.  Over the past 4 days has had wheezing, shortness of breath and increased fatigue.  Also has felt feverish.  In clinic today he is afebrile.  He is ill-appearing  but nontoxic.  Appears to be in mild distress (when speaking ) but still speaking in full sentences.  Sitting comfortably in a chair reading a book.  O2 sat is 100%.  Patient has nasal congestion on exam.  Also diffuse mild wheezing throughout all lung fields.  Chest x-ray ordered.  No evidence of lobar consolidation or effusion.  There is lingular scarring or possibly atelectasis.  Discussed results of x-ray with patient.  Given his diffuse wheezing but stable vital signs, suspect he likely has a viral bronchitis.  Since he has been sick for greater than a week and symptoms recently worsened with the shortness of breath.  We will try azithromycin to see if that helps in case there is a bacterial process.  Patient was given albuterol nebulizer in clinic.  States to help with her breathing.  Prescribed inhaler.  Also sent benzonatate.  Printed prescription for prednisone in case he is not starting feel better in the next few days.  COVID test obtained.  Current CDC guidelines, isolation protocol and ED precautions reviewed positive.  Patient will be out of the window for treatment the antiviral medicine if positive.  Advised him it would just be information at this point.  He is aware.  Reviewed return and ER precautions with patient regarding his symptoms.  Advise going to ED for any acute worsening of his breathing, fever or weakness.  Final Clinical Impressions(s) / UC Diagnoses   Final diagnoses:  Acute bronchitis, unspecified organism  Acute cough  Shortness of breath  Wheezing     Discharge Instructions      -Your x-ray does not show any evidence of pneumonia.  You have bronchitis.  This is almost always viral.  I have sent an antibiotic for you since  you have been sick over a week and you have the associated shortness of breath.  This may help if you are starting to develop a bacterial infection. - I have also sent an inhaler for you to try when you are feeling short of breath and having  wheezing. - Additionally there is a cough medication and a corticosteroid at the pharmacy.  Increase rest and fluids.  You can also do over-the-counter Coricidin HBP. - Continue your antihistamine. - The COVID test was performed today.  Results will be back today or tomorrow.  We will contact you if positive.  If you are positive you will need to isolate 5 days from symptom onset and wear a mask for 5 days. - Increase rest and fluids. - Go to ER if you have any uncontrolled fever, weakness or increased shortness of breath or wheezing.  Otherwise, please follow-up with PCP if not improving over the next 1 week.     ED Prescriptions     Medication Sig Dispense Auth. Provider   albuterol (VENTOLIN HFA) 108 (90 Base) MCG/ACT inhaler Inhale 1-2 puffs into the lungs every 6 (six) hours as needed for wheezing or shortness of breath. 1 g Laurene Footman B, PA-C   benzonatate (TESSALON) 200 MG capsule Take 1 capsule (200 mg total) by mouth 3 (three) times daily as needed for cough. 21 capsule Laurene Footman B, PA-C   azithromycin (ZITHROMAX) 250 MG tablet Take 1 tablet (250 mg total) by mouth daily. Take first 2 tablets together, then 1 every day until finished. 6 tablet Laurene Footman B, PA-C   predniSONE (DELTASONE) 20 MG tablet Take 2 tablets (40 mg total) by mouth daily for 5 days. 10 tablet Gretta Cool      PDMP not reviewed this encounter.   Danton Clap, PA-C 11/07/21 1140

## 2021-11-07 NOTE — ED Triage Notes (Signed)
Pt reports coughing and congestion x 3 days.  ?

## 2021-11-07 NOTE — Discharge Instructions (Addendum)
-  Your x-ray does not show any evidence of pneumonia.  You have bronchitis.  This is almost always viral.  I have sent an antibiotic for you since you have been sick over a week and you have the associated shortness of breath.  This may help if you are starting to develop a bacterial infection. ?- I have also sent an inhaler for you to try when you are feeling short of breath and having wheezing. ?- Additionally there is a cough medication and a corticosteroid at the pharmacy.  Increase rest and fluids.  You can also do over-the-counter Coricidin HBP. ?- Continue your antihistamine. ?- The COVID test was performed today.  Results will be back today or tomorrow.  We will contact you if positive.  If you are positive you will need to isolate 5 days from symptom onset and wear a mask for 5 days. ?- Increase rest and fluids. ?- Go to ER if you have any uncontrolled fever, weakness or increased shortness of breath or wheezing.  Otherwise, please follow-up with PCP if not improving over the next 1 week. ?

## 2021-11-08 LAB — SARS CORONAVIRUS 2 (TAT 6-24 HRS): SARS Coronavirus 2: NEGATIVE

## 2022-08-02 ENCOUNTER — Ambulatory Visit: Payer: Medicare Other | Admitting: Dermatology

## 2022-11-23 ENCOUNTER — Other Ambulatory Visit: Payer: Self-pay

## 2022-11-23 ENCOUNTER — Emergency Department
Admission: EM | Admit: 2022-11-23 | Discharge: 2022-11-24 | Disposition: A | Discharge status: Home / Self Care | Payer: Medicare Other | Attending: Emergency Medicine | Admitting: Emergency Medicine

## 2022-11-23 DIAGNOSIS — S80212A Abrasion, left knee, initial encounter: Secondary | ICD-10-CM

## 2022-11-23 DIAGNOSIS — W19XXXA Unspecified fall, initial encounter: Secondary | ICD-10-CM

## 2022-11-23 DIAGNOSIS — Z7901 Long term (current) use of anticoagulants: Secondary | ICD-10-CM | POA: Diagnosis not present

## 2022-11-23 DIAGNOSIS — S51012A Laceration without foreign body of left elbow, initial encounter: Secondary | ICD-10-CM

## 2022-11-23 DIAGNOSIS — Z23 Encounter for immunization: Secondary | ICD-10-CM | POA: Insufficient documentation

## 2022-11-23 DIAGNOSIS — S59902A Unspecified injury of left elbow, initial encounter: Secondary | ICD-10-CM | POA: Diagnosis present

## 2022-11-23 MED ORDER — TETANUS-DIPHTH-ACELL PERTUSSIS 5-2.5-18.5 LF-MCG/0.5 IM SUSY
0.5000 mL | PREFILLED_SYRINGE | Freq: Once | INTRAMUSCULAR | Status: AC
  Administered 2022-11-24: 0.5 mL via INTRAMUSCULAR
  Filled 2022-11-23: qty 0.5

## 2022-11-23 NOTE — ED Triage Notes (Signed)
Pt reports mechanical fall down 2 steps. Denies hitting head. Pt ambulatory to triage and denies pain in hip or pelvis. Skin tears noted to L forearm. Bleeding controlled. Clean bandage applied in triage. Pt alert and oriented following commands. Breathing unlabored with symmetric chest rise and fall.

## 2022-11-23 NOTE — ED Provider Notes (Signed)
[facility] Provider Note    Event Date/Time   First MD Initiated Contact with Patient 11/23/22 2317     (approximate)   History   skin tear   HPI  Gerald Weaver is a 87 y.o. male past history significant for prior DVT on Eliquis, who presents to the emergency department following a fall.  States that he was stepping out at a gas station and fell.  Denies any preceding chest pain or shortness of breath.  Denies any head injury or loss of consciousness.  States that he hit his left arm and knee.  Uncertain if his tetanus is up-to-date.  Denies any weakness.  Ambulatory on scene.  Denies any extremity numbness or weakness. Physical Exam   Triage Vital Signs: ED Triage Vitals [11/23/22 1954]  Enc Vitals Group     BP (!) 165/84     Pulse Rate 72     Resp 18     Temp 98 F (36.7 C)     Temp Source Oral     SpO2 99 %     Weight 200 lb (90.7 kg)     Height 5\' 7"  (1.702 m)     Head Circumference      Peak Flow      Pain Score 4     Pain Loc      Pain Edu?      Excl. in [place]?     Most recent vital signs: Vitals:   11/23/22 1954  BP: (!) 165/84  Pulse: 72  Resp: 18  Temp: 98 F (36.7 C)  SpO2: 99%    Physical Exam Constitutional:      Appearance: He is well-developed.  HENT:     Head: Atraumatic.  Eyes:     Conjunctiva/sclera: Conjunctivae normal.  Cardiovascular:     Rate and Rhythm: Regular rhythm.  Pulmonary:     Effort: No respiratory distress.  Musculoskeletal:     Cervical back: Normal range of motion.  Skin:    General: Skin is warm.     Comments: Skin tear to the left upper arm and the left forearm.  Abrasion to the left knee.  No active bleeding.  +2 radial pulses are equal bilaterally.  Full flexion and extension at the elbow and wrist.  Neurological:     Mental Status: He is alert. Mental status is at baseline.      IMPRESSION / MDM / ASSESSMENT AND PLAN / ED COURSE  I reviewed the triage vital signs and the nursing  notes.  Differential diagnosis including fracture, dislocation, skin tear.  No head injury, no altered mental status and has normal neurologic exam do not feel that CT scan of the head or neck is necessary at this time.  Patient's tetanus was updated.  Offered Tylenol however patient declined.  Low suspicion for ACS, no prodromal symptoms, no weakness.  No underlying bony tenderness have low suspicion for underlying fracture, do not feel that x-ray imaging is necessary at this time.  Wounds were dressed in the emergency department. Labs (all labs ordered are listed, but only abnormal results are displayed) Labs interpreted as -    Labs Reviewed - No data to display    Given return precautions for any signs of infection.  Discussed wound care and pain control.  Given follow-up information with primary care provider.   PROCEDURES:  Critical Care performed: No  Procedures  Patient's presentation is most consistent with acute illness / injury with  system symptoms.   MEDICATIONS ORDERED IN ED: Medications  Tdap (BOOSTRIX) injection 0.5 mL (has no administration in time range)    FINAL CLINICAL IMPRESSION(S) / ED DIAGNOSES   Final diagnoses:  Skin tear of left elbow without complication, initial encounter  Abrasion, left knee, initial encounter  Fall, initial encounter     Rx / DC Orders   ED Discharge Orders     None        Note:  This document was prepared using Dragon voice recognition software and may include unintentional dictation errors.   Nathaniel Man, MD 11/23/22 [phone]

## 2022-11-24 ENCOUNTER — Ambulatory Visit
Admission: EM | Admit: 2022-11-24 | Discharge: 2022-11-24 | Disposition: A | Discharge status: Home / Self Care | Payer: Medicare Other | Attending: Physician Assistant | Admitting: Physician Assistant

## 2022-11-24 ENCOUNTER — Encounter: Payer: Self-pay | Admitting: Emergency Medicine

## 2022-11-24 DIAGNOSIS — S51012A Laceration without foreign body of left elbow, initial encounter: Secondary | ICD-10-CM | POA: Diagnosis not present

## 2022-11-24 DIAGNOSIS — Z5189 Encounter for other specified aftercare: Secondary | ICD-10-CM | POA: Diagnosis not present

## 2022-11-24 DIAGNOSIS — S51812A Laceration without foreign body of left forearm, initial encounter: Secondary | ICD-10-CM

## 2022-11-24 NOTE — Discharge Instructions (Signed)
-  I have cleaned the area and re-banged it today. Change dressing again tomorrow and clean area gently with soap and water. -Return if you need dressing change or if signs of infection.

## 2022-11-24 NOTE — ED Triage Notes (Signed)
Patient was seen in the [facility] ED yesterday for a fall.  Patient states that he is here today to have his wound to his left forearm and elbow checked and for the dressing to be changed.

## 2022-11-24 NOTE — ED Provider Notes (Signed)
[facility]    CSN: [id number] Arrival date & time: 11/24/22  1009      History   Chief Complaint Chief Complaint  Patient presents with   Fall   Dressing Change   Wound Check    HPI Gerald Weaver is a 87 y.o. male presenting for left elbow pain and skin tear after a fall that occurred yesterday. He was seen at the ED late last night. No imaging obtained. Tdap given and supportive care advised. He would like to have his dressing changed today.  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 He says that he was stepping out at a gas station when he accidentally fell. He denies any head injury or LOC. Reporting injury to left arm and leg. No difficulty walking and denies numbness/weakness.  HPI  Past Medical History:  Diagnosis Date   Allergy    Hypertension     There are no problems to display for this patient.   Past Surgical History:  Procedure Laterality Date   APPENDECTOMY     COLONOSCOPY WITH PROPOFOL N/A 10/07/2020   Procedure: COLONOSCOPY WITH PROPOFOL;  Surgeon: Toledo, Benay Pike, MD;  Location: [facility] ENDOSCOPY;  Service: Gastroenterology;  Laterality: N/A;   EYE SURGERY     NASAL SEPTUM SURGERY         Home Medications    Prior to Admission medications   Medication Sig Start Date End Date Taking? Authorizing Provider  acyclovir (ZOVIRAX) 400 MG tablet  10/05/13   [provider]  albuterol (VENTOLIN HFA) 108 (90 Base) MCG/ACT inhaler Inhale 1-2 puffs into the lungs every 6 (six) hours as needed for wheezing or shortness of breath. 11/07/21   Danton Clap, PA-C  apixaban (ELIQUIS) 5 MG TABS tablet Take 5 mg by mouth 2 (two) times daily.    [provider]  azithromycin (ZITHROMAX) 250 MG tablet Take 1 tablet (250 mg total) by mouth daily. Take first 2 tablets together, then 1 every day until finished. 11/07/21   Laurene Footman B, PA-C  B Complex-C (SUPER B COMPLEX PO) Take by mouth.    [provider]  benzonatate (TESSALON) 200 MG capsule Take 1 capsule (200 mg total) by mouth 3 (three) times daily as needed for cough. 11/07/21   Danton Clap, PA-C  cetirizine (ZYRTEC) 10 MG tablet Take 10 mg by mouth daily.    [provider]  glucosamine-chondroitin 500-400 MG tablet Take 1 tablet by mouth 3 (three) times daily. Patient not taking: Reported on 10/07/2020    [provider]  levothyroxine (SYNTHROID, LEVOTHROID) 125 MCG tablet 125 mcg.  09/18/13   [provider]  lisinopril-hydrochlorothiazide (PRINZIDE,ZESTORETIC) 10-12.5 MG per tablet  09/18/13   [provider]  meloxicam (MOBIC) 15 MG tablet Take 1 tablet (15 mg total) by mouth daily. 11/19/13   Hyatt, Max T, DPM  Multiple Vitamins-Minerals (CENTRUM SILVER ADULT 50+ PO) Take by mouth.    [provider]  Saw Palmetto 450 MG CAPS Take by mouth. Patient not taking: Reported on 10/07/2020    [provider]  Triamcinolone Acetonide (NASACORT AQ NA) Place into the nose.    [provider]    Family History History reviewed. No pertinent family history.  Social History Social History   Tobacco Use   Smoking status: Former    Types: Cigarettes   Smokeless tobacco: Never  Vaping Use   Vaping Use: Never used  Substance Use Topics  Alcohol use: Yes    Comment: ocassionally    Drug use: No     Allergies   Augmentin [amoxicillin-pot clavulanate]   Review of Systems Review of Systems  Constitutional:  Negative for fatigue.  Musculoskeletal:  Negative for arthralgias, back pain, gait problem and joint swelling.  Skin:  Positive for color change and wound.  Neurological:  Negative for syncope, weakness and numbness.  Hematological:  Bruises/bleeds easily.     Physical Exam Triage Vital Signs ED Triage Vitals  Enc Vitals Group     BP      Pulse      Resp      Temp      Temp src      SpO2      Weight      Height      Head Circumference       Peak Flow      Pain Score      Pain Loc      Pain Edu?      Excl. in [place]?    No data found.  Updated Vital Signs BP 135/69 (BP Location: Right Arm)   Pulse (!) 58   Temp 97.6 F (36.4 C) (Oral)   Resp 15   Ht 5\' 7"  (1.702 m)   Wt 199 lb 15.3 oz (90.7 kg)   SpO2 97%   BMI 31.32 kg/m   Physical Exam Vitals and nursing note reviewed.  Constitutional:      General: He is not in acute distress.    Appearance: Normal appearance. He is well-developed. He is not ill-appearing.  HENT:     Head: Normocephalic and atraumatic.     Nose: Nose normal.     Mouth/Throat:     Mouth: Mucous membranes are moist.     Pharynx: Oropharynx is clear.  Eyes:     General: No scleral icterus.    Conjunctiva/sclera: Conjunctivae normal.  Cardiovascular:     Rate and Rhythm: Regular rhythm. Bradycardia present.  Pulmonary:     Effort: Pulmonary effort is normal. No respiratory distress.     Breath sounds: Normal breath sounds.  Musculoskeletal:     Cervical back: Neck supple.  Skin:    General: Skin is warm and dry.     Capillary Refill: Capillary refill takes less than 2 seconds.     Comments: Mild abrasions and skin tears to the left posterior and the left forearm.  Mild active bleeding of one small area of forearm.  +2 radial pulses are equal bilaterally.  Full flexion and extension at the elbow and wrist. No bony tenderness.  Neurological:     General: No focal deficit present.     Mental Status: He is alert and oriented to person, place, and time. Mental status is at baseline.     Motor: No weakness.     Gait: Gait normal.  Psychiatric:        Mood and Affect: Mood normal.      UC Treatments / Results  Labs (all labs ordered are listed, but only abnormal results are displayed) Labs Reviewed - No data to display  EKG   Radiology No results found.  Procedures Wound Care  Date/Time: 11/24/2022 11:42 AM  Performed by: Danton Clap, PA-C Authorized by: Danton Clap,  PA-C   Consent:    Consent obtained:  Verbal   Consent given by:  Patient   Risks, benefits, and alternatives were discussed: yes     Risks  discussed:  Pain and bleeding   Alternatives discussed:  No treatment, delayed treatment, alternative treatment and observation Universal protocol:    Patient identity confirmed:  Verbally with patient Sedation:    Sedation type:  None Anesthesia:    Anesthesia method:  None Procedure details:    Indications: open wounds     Wound location:  Arm   Shoulder/arm location:  L lower arm   Wound age (days):  1   Debridement performed: Yes     Debridement type: selective     Debridement mechanism:  Scissors   Devitalized tissue debrided comment:  Small areas of hanging skin of forearm and elbow trimmed off Dressing:    Dressing applied:  Telfa pad   Wrapped with:  Coban 4 inch Post-procedure details:    Procedure completion:  Tolerated well, no immediate complications Comments:     Cleaned wounds with wound cleanser spray, debridement of 3 small areas of hanging skin. Covered with Telfa pad, non adherent pad and Coban.  (including critical care time)  Medications Ordered in UC Medications - No data to display  Initial Impression / Assessment and Plan / UC Course  I have reviewed the triage vital signs and the nursing notes.  Pertinent labs & imaging results that were available during my care of the patient were reviewed by me and considered in my medical decision making (see chart for details).   87 year old male presents for wound/abrasion and skin tear to left forearm.  This occurred yesterday when he actually fell.  He was seen in the emergency department last night and had bandage placed.  He would like the bandage to be redressed because he has had some leakage through the dressing.  Denies any bleeding.  No significant pain.  No bony tenderness.  No imaging performed.  Tetanus updated to ER.  Remove bandage and cleanse wound with wound  cleanser.  Debridement of 3 small areas with hanging skin.  Trimmed all the hanging skin.  Cover with Telfa pad, nonadherent pad to elbow and Coban.  He tolerated this well.  He still does not have any areas of tenderness of the bony aspects of his elbow or forearm.  Full range of motion of elbow. Advised to keep area clean and change bandage daily. Return for dressing changes if needed or signs of infection.   Final Clinical Impressions(s) / UC Diagnoses   Final diagnoses:  Visit for wound check  Skin tear of left elbow without complication, initial encounter  Skin tear of left forearm without complication, initial encounter     Discharge Instructions      -I have cleaned the area and re-banged it today. Change dressing again tomorrow and clean area gently with soap and water. -Return if you need dressing change or if signs of infection.     ED Prescriptions   None    PDMP not reviewed this encounter.   Danton Clap, PA-C 11/24/22 1148

## 2022-11-27 ENCOUNTER — Ambulatory Visit
Admission: RE | Admit: 2022-11-27 | Discharge: 2022-11-27 | Disposition: A | Discharge status: Home / Self Care | Payer: Medicare Other | Source: Ambulatory Visit | Attending: Emergency Medicine | Admitting: Emergency Medicine

## 2022-11-27 VITALS — BP 146/75 | HR 74 | Temp 97.9°F | Resp 16

## 2022-11-27 DIAGNOSIS — Z5189 Encounter for other specified aftercare: Secondary | ICD-10-CM

## 2022-11-27 NOTE — ED Triage Notes (Signed)
Pt was seen 3/29 for a skin tear advised to return today for wound check.

## 2022-11-27 NOTE — ED Provider Notes (Signed)
[facility]    CSN: [id number] Arrival date & time: 11/27/22  1004      History   Chief Complaint Chief Complaint  Patient presents with   Wound Check    HPI Gerald Weaver is a 87 y.o. male.   HPI  87 year old male with a past medical history significant for hypertension, melanoma, squamous cell carcinoma, hypothyroidism, mixed hyperlipidemia, chronic renal insufficiency stage III, and DVT on longstanding anticoagulant therapy presents for wound check.  The patient was originally evaluated in the ER at [facility] on 11/23/2022 for a skin tear to the left elbow and subsequently in this urgent care for a wound check on 11/24/2022.  Past Medical History:  Diagnosis Date   Allergy    Hypertension     There are no problems to display for this patient.   Past Surgical History:  Procedure Laterality Date   APPENDECTOMY     COLONOSCOPY WITH PROPOFOL N/A 10/07/2020   Procedure: COLONOSCOPY WITH PROPOFOL;  Surgeon: Toledo, Benay Pike, MD;  Location: [facility] ENDOSCOPY;  Service: Gastroenterology;  Laterality: N/A;   EYE SURGERY     NASAL SEPTUM SURGERY         Home Medications    Prior to Admission medications   Medication Sig Start Date End Date Taking? Authorizing Provider  acyclovir (ZOVIRAX) 400 MG tablet  10/05/13   [provider]  albuterol (VENTOLIN HFA) 108 (90 Base) MCG/ACT inhaler Inhale 1-2 puffs into the lungs every 6 (six) hours as needed for wheezing or shortness of breath. 11/07/21   Danton Clap, PA-C  apixaban (ELIQUIS) 5 MG TABS tablet Take 5 mg by mouth 2 (two) times daily.    [provider]  azithromycin (ZITHROMAX) 250 MG tablet Take 1 tablet (250 mg total) by mouth daily. Take first 2 tablets together, then 1 every day until finished. 11/07/21   Laurene Footman B, PA-C  B Complex-C (SUPER B COMPLEX PO) Take by mouth.    [provider]  benzonatate (TESSALON) 200 MG capsule Take 1 capsule (200 mg total) by mouth 3 (three)  times daily as needed for cough. 11/07/21   Danton Clap, PA-C  cetirizine (ZYRTEC) 10 MG tablet Take 10 mg by mouth daily.    [provider]  glucosamine-chondroitin 500-400 MG tablet Take 1 tablet by mouth 3 (three) times daily. Patient not taking: Reported on 10/07/2020    [provider]  levothyroxine (SYNTHROID, LEVOTHROID) 125 MCG tablet 125 mcg.  09/18/13   [provider]  lisinopril-hydrochlorothiazide (PRINZIDE,ZESTORETIC) 10-12.5 MG per tablet  09/18/13   [provider]  meloxicam (MOBIC) 15 MG tablet Take 1 tablet (15 mg total) by mouth daily. 11/19/13   Hyatt, Max T, DPM  Multiple Vitamins-Minerals (CENTRUM SILVER ADULT 50+ PO) Take by mouth.    [provider]  Saw Palmetto 450 MG CAPS Take by mouth. Patient not taking: Reported on 10/07/2020    [provider]  Triamcinolone Acetonide (NASACORT AQ NA) Place into the nose.    [provider]    Family History History reviewed. No pertinent family history.  Social History Social History   Tobacco Use   Smoking status: Former    Types: Cigarettes   Smokeless tobacco: Never  Vaping Use   Vaping Use: Never used  Substance Use Topics   Alcohol use: Yes    Comment: ocassionally    Drug use: No     Allergies   Montelukast and Augmentin [amoxicillin-pot clavulanate]  Review of Systems Review of Systems  Constitutional:  Negative for fever.  Musculoskeletal:  Positive for myalgias.  Skin:  Positive for color change and wound.     Physical Exam Triage Vital Signs ED Triage Vitals [11/27/22 1030]  Enc Vitals Group     BP      Pulse      Resp      Temp      Temp src      SpO2      Weight      Height      Head Circumference      Peak Flow      Pain Score 0     Pain Loc      Pain Edu?      Excl. in [place]?    No data found.  Updated Vital Signs BP (!) 146/75 (BP Location: Right Arm)   Pulse 74   Temp 97.9 F (36.6 C) (Oral)   Resp 16    Visual Acuity Right Eye Distance:   Left Eye Distance:   Bilateral Distance:    Right Eye Near:   Left Eye Near:    Bilateral Near:     Physical Exam Vitals and nursing note reviewed.  Constitutional:      Appearance: Normal appearance. He is not ill-appearing.  HENT:     Head: Normocephalic and atraumatic.  Musculoskeletal:        General: No tenderness.  Skin:    General: Skin is warm.     Capillary Refill: Capillary refill takes less than 2 seconds.     Findings: Bruising present.  Neurological:     Mental Status: He is alert.      UC Treatments / Results  Labs (all labs ordered are listed, but only abnormal results are displayed) Labs Reviewed - No data to display  EKG   Radiology No results found.  Procedures Procedures (including critical care time)  Medications Ordered in UC Medications - No data to display  Initial Impression / Assessment and Plan / UC Course  I have reviewed the triage vital signs and the nursing notes.  Pertinent labs & imaging results that were available during my care of the patient were reviewed by me and considered in my medical decision making (see chart for details).   Patient is a pleasant, nontoxic-appearing 87 year old male who presents for wound check of multiple skin tears on his left forearm and left elbow.      As you can see in the images above the abrasions on the posterior medial aspect of the left forearm are dry and starting to scab, as is the skin tear on the left elbow.  There is an area of ecchymosis around the elbow.  The distal dorsal left forearm has a moist wound base.  Patient has been keeping petroleum gauze on top of the wound.  I have advised him to stop applying petroleum gauze but to keep it dry, nonstick dressing on until scab is formed.  Once a scab forms he can leave it open to air and only cover it when he goes out in public.  I have advised him that if he develops any swelling, redness, pus  drainage, red streaks, or fever he needs to return for evaluation.   Final Clinical Impressions(s) / UC Diagnoses   Final diagnoses:  Visit for wound check     Discharge Instructions      Keep a dry, nonstick dressing on the wound near your  wrist on your left forearm.  Make sure that you wash the area daily and change the dressing daily.  The rest of the injured tissue has started to develop scabs and can be left open to air when at home and covered when out in public.  If you develop any swelling, redness, increased pain, pus drainage, red streaks, or fever you need to return for reevaluation.     ED Prescriptions   None    PDMP not reviewed this encounter.   Margarette Canada, NP 11/27/22 1048

## 2022-11-27 NOTE — Discharge Instructions (Addendum)
Keep a dry, nonstick dressing on the wound near your wrist on your left forearm.  Make sure that you wash the area daily and change the dressing daily.  The rest of the injured tissue has started to develop scabs and can be left open to air when at home and covered when out in public.  If you develop any swelling, redness, increased pain, pus drainage, red streaks, or fever you need to return for reevaluation.

## 2023-10-25 IMAGING — CR DG CHEST 2V
2 series · 2 of 2 positions shown · non-contrast
Comparison: None

CLINICAL DATA: A mallet age 85 presents for evaluation of cough and
wheezing and shortness of breath for 1 week.

EXAM:
CHEST - 2 VIEW

[chest pa]
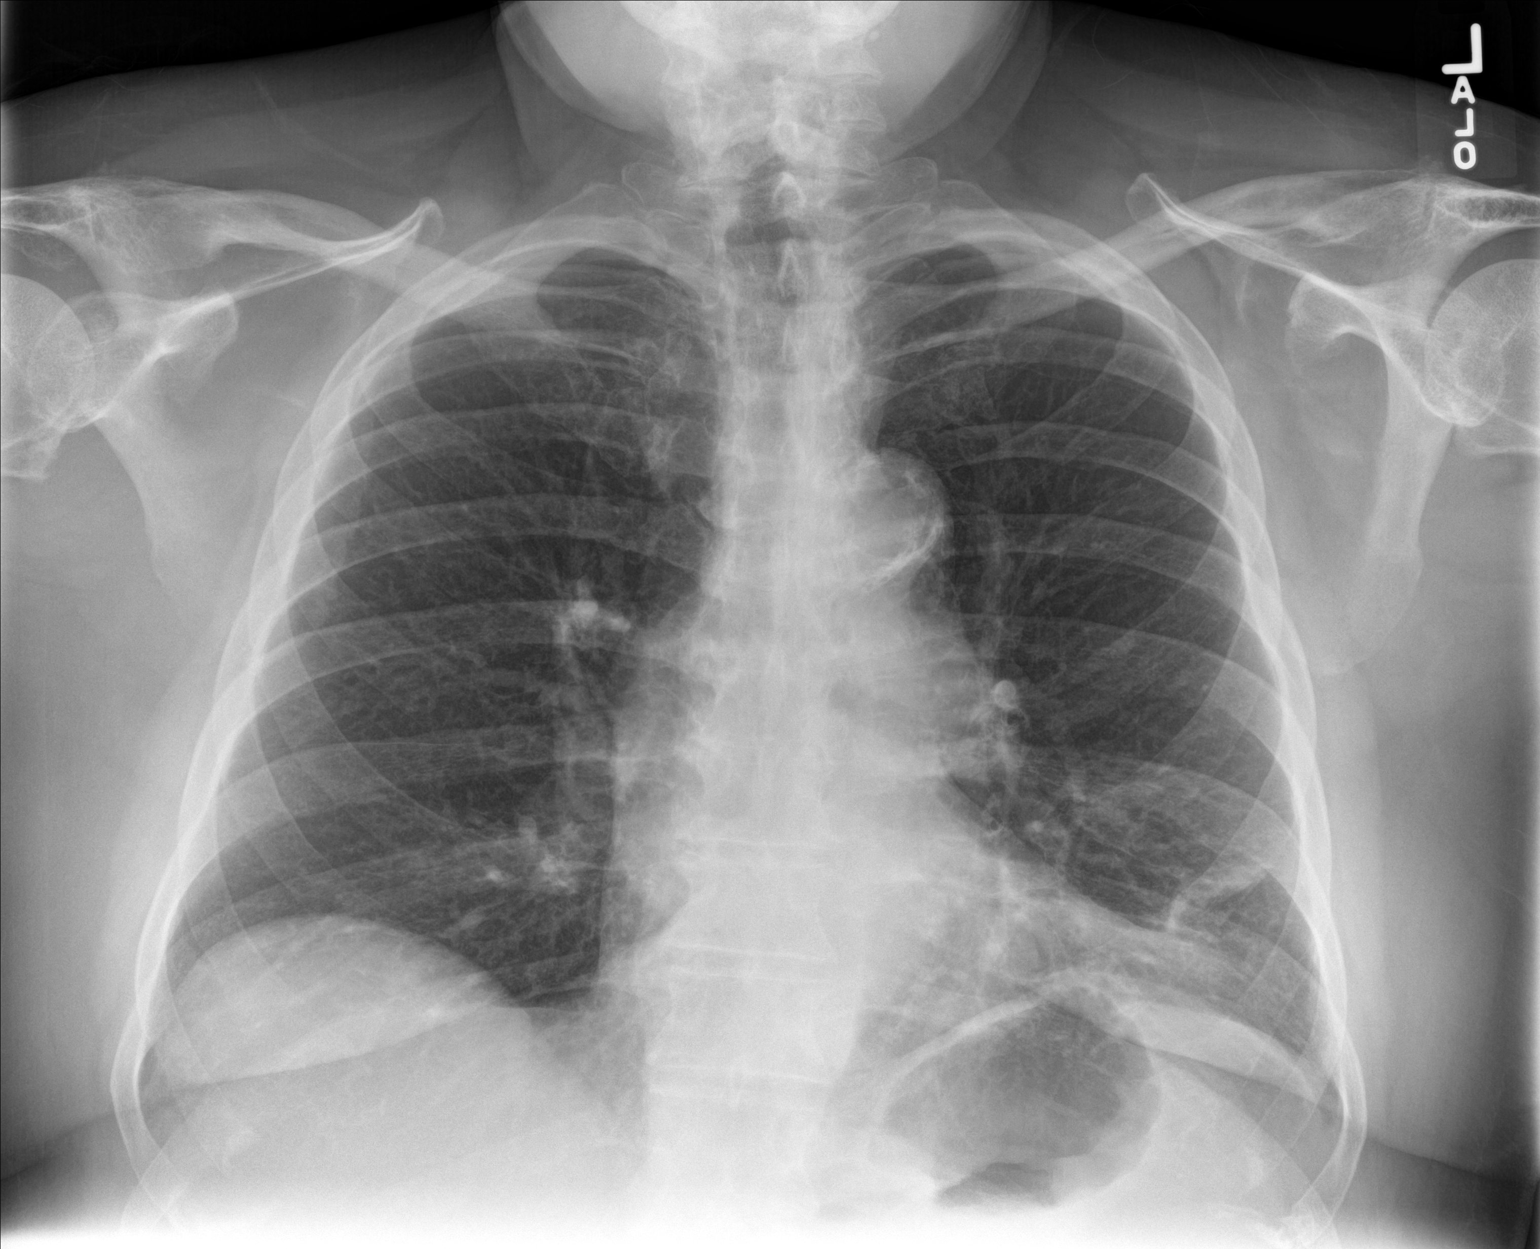

[chest lat]
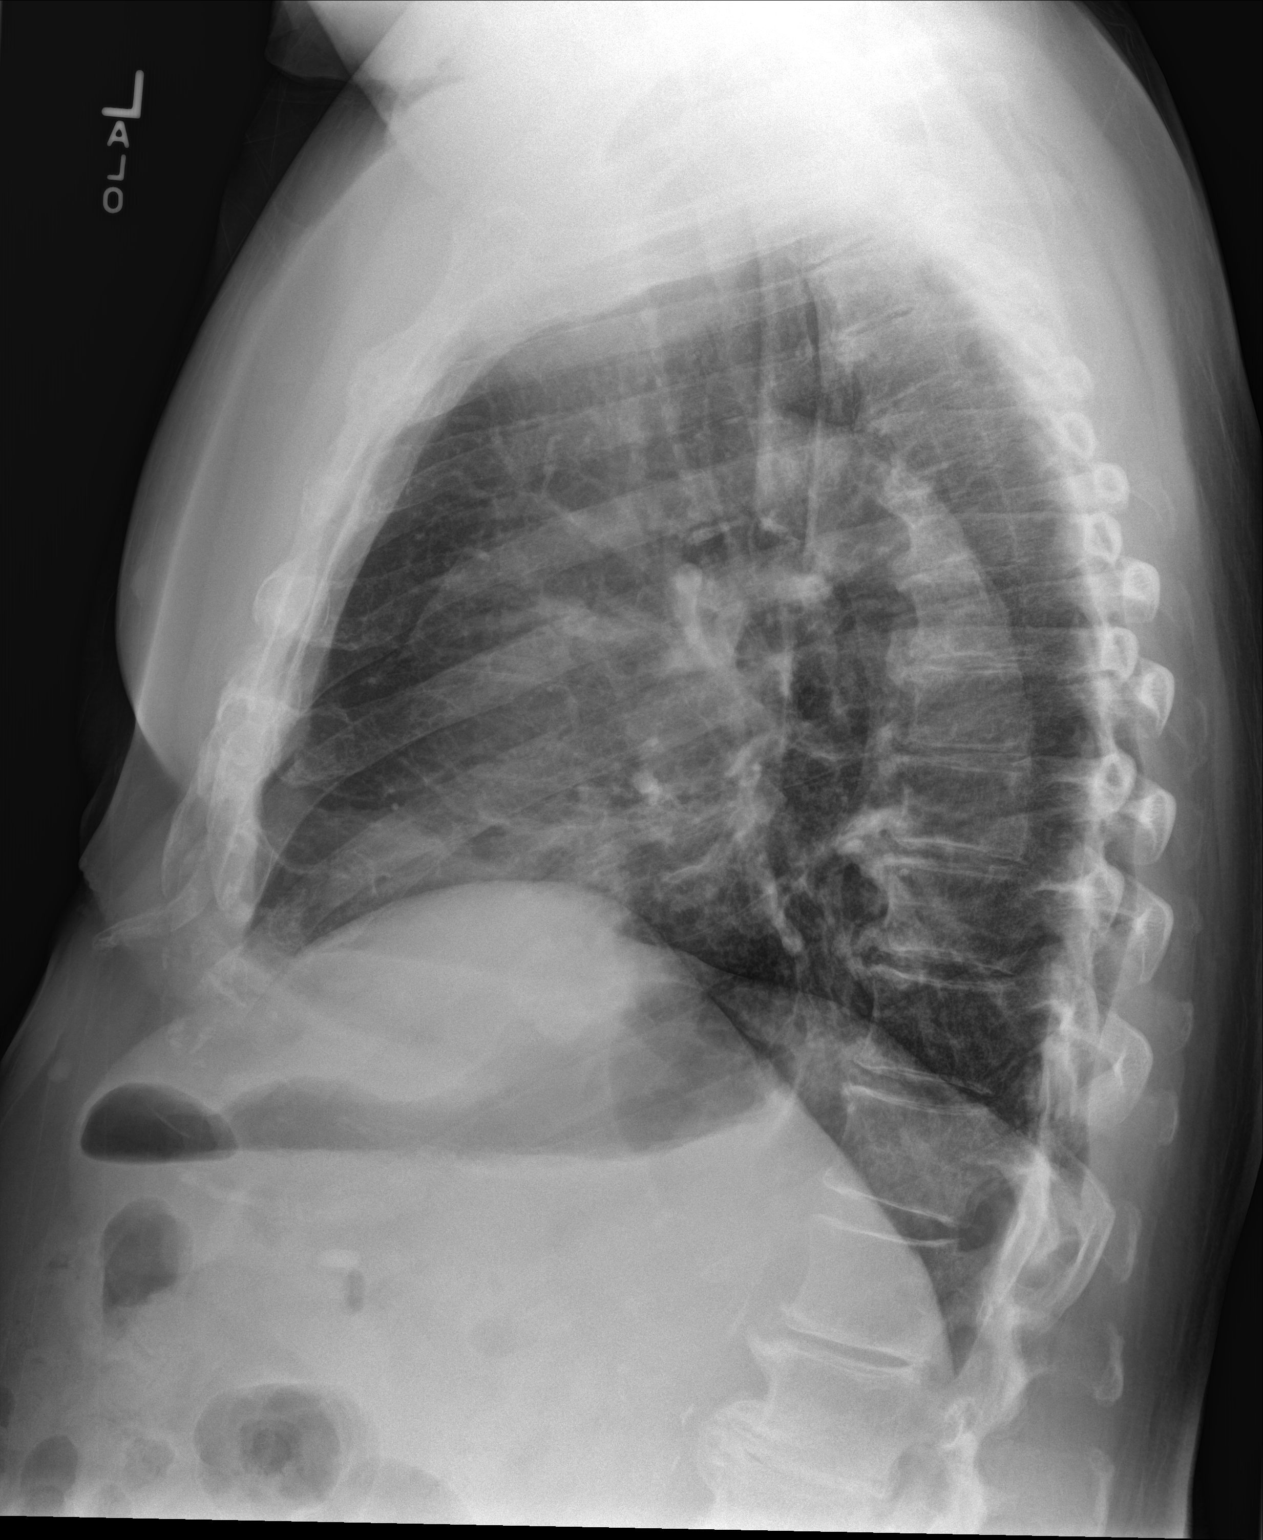

[2 of 2 positions shown; findings below may reference images not displayed]

FINDINGS: Cardiomediastinal contours and hilar structures are normal.

Bandlike area of added density in the lingula.

No lobar consolidative changes. No pneumothorax. No pleural
effusion.

On limited assessment there is no acute skeletal process.
IMPRESSION: Area of suspected lingular scarring or atelectasis. No priors for
comparison. No lobar consolidation or effusion.

## 2024-04-26 ENCOUNTER — Emergency Department
Admission: EM | Admit: 2024-04-26 | Discharge: 2024-04-26 | Disposition: A | Discharge status: Home / Self Care | Attending: Emergency Medicine | Admitting: Emergency Medicine

## 2024-04-26 ENCOUNTER — Emergency Department

## 2024-04-26 ENCOUNTER — Other Ambulatory Visit: Payer: Self-pay

## 2024-04-26 DIAGNOSIS — W01198A Fall on same level from slipping, tripping and stumbling with subsequent striking against other object, initial encounter: Secondary | ICD-10-CM | POA: Insufficient documentation

## 2024-04-26 DIAGNOSIS — S0990XA Unspecified injury of head, initial encounter: Secondary | ICD-10-CM | POA: Diagnosis present

## 2024-04-26 DIAGNOSIS — Z7901 Long term (current) use of anticoagulants: Secondary | ICD-10-CM | POA: Insufficient documentation

## 2024-04-26 DIAGNOSIS — R10819 Abdominal tenderness, unspecified site: Secondary | ICD-10-CM | POA: Diagnosis present

## 2024-04-26 DIAGNOSIS — S0081XA Abrasion of other part of head, initial encounter: Secondary | ICD-10-CM | POA: Insufficient documentation

## 2024-04-26 DIAGNOSIS — W19XXXA Unspecified fall, initial encounter: Secondary | ICD-10-CM

## 2024-04-26 DIAGNOSIS — I700 Atherosclerosis of aorta: Secondary | ICD-10-CM | POA: Insufficient documentation

## 2024-04-26 DIAGNOSIS — R0781 Pleurodynia: Secondary | ICD-10-CM | POA: Insufficient documentation

## 2024-04-26 LAB — CBC WITH DIFFERENTIAL/PLATELET
Abs Immature Granulocytes: 0.03 K/uL (ref 0.00–0.07)
Basophils Absolute: 0.1 K/uL (ref 0.0–0.1)
Basophils Relative: 1 %
Eosinophils Absolute: 0.2 K/uL (ref 0.0–0.5)
Eosinophils Relative: 2 %
HCT: 39.7 % (ref 39.0–52.0)
Hemoglobin: 12.9 g/dL — ABNORMAL LOW (ref 13.0–17.0)
Immature Granulocytes: 0 %
Lymphocytes Relative: 20 %
Lymphs Abs: 1.4 K/uL (ref 0.7–4.0)
MCH: 28.5 pg (ref 26.0–34.0)
MCHC: 32.5 g/dL (ref 30.0–36.0)
MCV: 87.8 fL (ref 80.0–100.0)
Monocytes Absolute: 0.7 K/uL (ref 0.1–1.0)
Monocytes Relative: 10 %
Neutro Abs: 4.9 K/uL (ref 1.7–7.7)
Neutrophils Relative %: 67 %
Platelets: 210 K/uL (ref 150–400)
RBC: 4.52 MIL/uL (ref 4.22–5.81)
RDW: 14.4 % (ref 11.5–15.5)
WBC: 7.3 K/uL (ref 4.0–10.5)
nRBC: 0 % (ref 0.0–0.2)

## 2024-04-26 LAB — COMPREHENSIVE METABOLIC PANEL WITH GFR
ALT: 20 U/L (ref 0–44)
AST: 33 U/L (ref 15–41)
Albumin: 4.1 g/dL (ref 3.5–5.0)
Alkaline Phosphatase: 43 U/L (ref 38–126)
Anion gap: 8 (ref 5–15)
BUN: 28 mg/dL — ABNORMAL HIGH (ref 8–23)
CO2: 24 mmol/L (ref 22–32)
Calcium: 9.1 mg/dL (ref 8.9–10.3)
Chloride: 106 mmol/L (ref 98–111)
Creatinine, Ser: 1 mg/dL (ref 0.61–1.24)
GFR, Estimated: >60 mL/min (ref >60)
Glucose, Bld: 93 mg/dL (ref 70–99)
Potassium: 4.1 mmol/L (ref 3.5–5.1)
Sodium: 138 mmol/L (ref 135–145)
Total Bilirubin: 0.9 mg/dL (ref 0.0–1.2)
Total Protein: 7 g/dL (ref 6.5–8.1)

## 2024-04-26 LAB — LIPASE, BLOOD: Lipase: 38 U/L (ref 11–51)

## 2024-04-26 MED ORDER — ACETAMINOPHEN 500 MG PO TABS
ORAL_TABLET | ORAL | Status: AC
  Filled 2024-04-26: qty 1

## 2024-04-26 MED ORDER — LIDOCAINE 5 % EX PTCH: 1.0000 | MEDICATED_PATCH | CUTANEOUS | 0 refills | Status: AC

## 2024-04-26 MED ORDER — LIDOCAINE 5 % EX PTCH
1.0000 | MEDICATED_PATCH | CUTANEOUS | Status: DC
  Administered 2024-04-26: 1 via TRANSDERMAL
  Filled 2024-04-26: qty 1

## 2024-04-26 MED ORDER — ACETAMINOPHEN 500 MG PO TABS
1000.0000 mg | ORAL_TABLET | Freq: Once | ORAL | Status: AC
  Administered 2024-04-26: 1000 mg via ORAL
  Filled 2024-04-26: qty 2

## 2024-04-26 MED ORDER — IOHEXOL 300 MG/ML  SOLN
100.0000 mL | Freq: Once | INTRAMUSCULAR | Status: AC | PRN
  Administered 2024-04-26: 100 mL via INTRAVENOUS

## 2024-04-26 NOTE — ED Provider Notes (Addendum)
 [facility] Provider Note    Event Date/Time   First MD Initiated Contact with Patient 04/26/24 1655     (approximate)   History   Fall   HPI  Gerald Weaver is a 88 y.o. male with history of DVT on Eliquis who comes in for a fall.  Patient reports mechanical fall in which his foot hit the edge of a concrete slab and he caught his foot  where he then fell and hit the left forehead, left chest wall, left back area  He tried to catch himself with his left hand and reports bending back his 4th and 5th digit.  He reports some pain on his left back area as well.  He is ambulatory denies any LOC, chest pain, shortness of breath prior to the fall.  Denies actually falling down steps-- jsut got toe on the edge of concrete pad.   Physical Exam   Triage Vital Signs: ED Triage Vitals  Encounter Vitals Group     BP 04/26/24 1650 (!) 170/94     Girls Systolic BP Percentile --      Girls Diastolic BP Percentile --      Boys Systolic BP Percentile --      Boys Diastolic BP Percentile --      Pulse Rate 04/26/24 1650 75     Resp 04/26/24 1650 20     Temp 04/26/24 1650 98 F (36.7 C)     Temp Source 04/26/24 1650 Oral     SpO2 04/26/24 1650 95 %     Weight 04/26/24 1648 191 lb (86.6 kg)     Height 04/26/24 1648 5' 7 (1.702 m)     Head Circumference --      Peak Flow --      Pain Score 04/26/24 1647 8     Pain Loc --      Pain Education --      Exclude from Growth Chart --     Most recent vital signs: Vitals:   04/26/24 1658 04/26/24 1659  BP: (!) 147/75   Pulse: 62   Resp:    Temp:    SpO2:  96%     General: Awake, no distress.  CV:  Good peripheral perfusion.  Resp:  Normal effort.  Abd:  No distention.  Soft and nontender Other:  No obvious bruising noted but does have tenderness on the left chest rib cage and left flank area. Abrasion noted to the forehead  ED Results / Procedures / Treatments   Labs (all labs ordered are listed, but  only abnormal results are displayed) Labs Reviewed  CBC WITH DIFFERENTIAL/PLATELET - Abnormal; Notable for the following components:      Result Value   Hemoglobin 12.9 (*)    All other components within normal limits  COMPREHENSIVE METABOLIC PANEL WITH GFR - Abnormal; Notable for the following components:   BUN 28 (*)    All other components within normal limits  LIPASE, BLOOD  URINALYSIS, ROUTINE W REFLEX MICROSCOPIC      RADIOLOGY I have reviewed the ct personally and interpreted no ICH    PROCEDURES:  Critical Care performed: No  Procedures   MEDICATIONS ORDERED IN ED: Medications  acetaminophen  (TYLENOL ) tablet 1,000 mg (has no administration in time range)  lidocaine  (LIDODERM ) 5 % 1 patch (has no administration in time range)  iohexol  (OMNIPAQUE ) 300 MG/ML solution 100 mL (100 mLs Intravenous Contrast Given 04/26/24 1823)     IMPRESSION /  MDM / ASSESSMENT AND PLAN / ED COURSE  I reviewed the triage vital signs and the nursing notes.   Patient's presentation is most consistent with acute presentation with potential threat to life or bodily function.   Patient comes in for a fall on blood thinners.  CT imaging ordered evaluate for intercranial hemorrhage, cervical fracture, rib fracture, retroperitoneal bleeding.  It was not syncopal in nature denied any chest pain, shortness of breath and reported a mechanical fall but blood work will be done in order to get CT scans with contrast.   IMPRESSION: 1. No acute traumatic injury to the chest, abdomen, and pelvis. 2. Aortic atherosclerosis (ICD10-170.0)   CT head was negative MPRESSION: 1. No acute abnormality of the cervical spine related to the reported neck trauma. 2. Multilevel degenerative changes throughout the cervical spine, including severe left and moderate right foraminal stenosis at C3-C4, moderate central and left greater than right foraminal narrowing at C4-5, and moderate foraminal stenosis  bilaterally at C5-6 and C6-7.  Copies of the report were provided to patient.  Reevaluated patient we discussed doing an EKG/trop but he is adamant that he is not have any any chest pain or syncopal symptoms and that was a mechanical fall so declined this testing today which I think is reasonable.  He is got reproducible left chest wall pain.  Patient's Tdap is up-to-date.  CT imaging reassuring.  We discussed Tylenol , lidocaine  patches for pain and return to the ER if he develops worsening symptoms or any other concern  The patient is on the cardiac monitor to evaluate for evidence of arrhythmia and/or significant heart rate changes.      FINAL CLINICAL IMPRESSION(S) / ED DIAGNOSES   Final diagnoses:  Fall, initial encounter  Injury of head, initial encounter     Rx / DC Orders   ED Discharge Orders          Ordered    lidocaine  (LIDODERM ) 5 %  Every 24 hours        04/26/24 1936             Note:  This document was prepared using Dragon voice recognition software and may include unintentional dictation errors.   Ernest Ronal BRAVO, MD 04/26/24 [place]    Ernest Ronal BRAVO, MD 04/26/24 [phone]

## 2024-04-26 NOTE — ED Notes (Signed)
 Pt verbalizes understanding of discharge instructions. Pt left exam room ambulatory no distress noted

## 2024-04-26 NOTE — ED Triage Notes (Signed)
 Pt to ED for mechanical fall today, missed a step on the stairs. No LOC. Hit L forehead, last 2 fingers on L hand bent back, and L upper back. Pt has hematoma and abrasion to forehead. Pt is ambulatory. Takes Eliquis BID.

## 2024-04-26 NOTE — Discharge Instructions (Addendum)
 You can take acetaminophen  1 g every 8 hours to help with pain for one week and use the lidocaine  patches to help with pain and return to the ER for worsening symptoms or any other concerns but luckily your CT imaging was negative for more serious injuries.   IMPRESSION: 1. No acute traumatic injury to the chest, abdomen, and pelvis. 2. Aortic atherosclerosis (ICD10-170.0)   CT head was negative MPRESSION: 1. No acute abnormality of the cervical spine related to the reported neck trauma. 2. Multilevel degenerative changes throughout the cervical spine, including severe left and moderate right foraminal stenosis at C3-C4, moderate central and left greater than right foraminal narrowing at C4-5, and moderate foraminal stenosis bilaterally at C5-6 and C6-7.
